# Patient Record
Sex: Female | Born: 1975 | Race: White | Hispanic: No | Marital: Married | Smoking: Never smoker
Health system: Southern US, Community
[De-identification: ages and names within clinical notes are randomized; demographics above are authoritative.]

## PROBLEM LIST (undated history)

## (undated) DIAGNOSIS — O009 Unspecified ectopic pregnancy without intrauterine pregnancy: Secondary | ICD-10-CM

## (undated) DIAGNOSIS — R7989 Other specified abnormal findings of blood chemistry: Secondary | ICD-10-CM

## (undated) HISTORY — PX: DILATION AND CURETTAGE OF UTERUS: SHX78

## (undated) HISTORY — DX: Other specified abnormal findings of blood chemistry: R79.89

## (undated) HISTORY — PX: SALPINGECTOMY: SHX328

---

## 2000-06-24 ENCOUNTER — Other Ambulatory Visit: Admission: RE | Admit: 2000-06-24 | Discharge: 2000-06-24 | Payer: Self-pay | Admitting: Obstetrics and Gynecology

## 2001-09-05 ENCOUNTER — Other Ambulatory Visit: Admission: RE | Admit: 2001-09-05 | Discharge: 2001-09-05 | Payer: Self-pay | Admitting: Obstetrics & Gynecology

## 2003-02-28 ENCOUNTER — Other Ambulatory Visit: Admission: RE | Admit: 2003-02-28 | Discharge: 2003-02-28 | Payer: Self-pay | Admitting: Obstetrics and Gynecology

## 2004-04-22 ENCOUNTER — Other Ambulatory Visit: Admission: RE | Admit: 2004-04-22 | Discharge: 2004-04-22 | Payer: Self-pay | Admitting: Obstetrics and Gynecology

## 2004-05-12 ENCOUNTER — Observation Stay (HOSPITAL_COMMUNITY): Admission: EM | Admit: 2004-05-12 | Discharge: 2004-05-13 | Payer: Self-pay | Admitting: Emergency Medicine

## 2004-06-25 ENCOUNTER — Inpatient Hospital Stay (HOSPITAL_COMMUNITY): Admission: AD | Admit: 2004-06-25 | Discharge: 2004-06-25 | Payer: Self-pay | Admitting: Obstetrics and Gynecology

## 2004-07-04 ENCOUNTER — Inpatient Hospital Stay (HOSPITAL_COMMUNITY): Admission: AD | Admit: 2004-07-04 | Discharge: 2004-07-04 | Payer: Self-pay | Admitting: Obstetrics and Gynecology

## 2004-07-07 ENCOUNTER — Inpatient Hospital Stay (HOSPITAL_COMMUNITY): Admission: AD | Admit: 2004-07-07 | Discharge: 2004-07-07 | Payer: Self-pay | Admitting: Obstetrics and Gynecology

## 2004-07-10 ENCOUNTER — Inpatient Hospital Stay (HOSPITAL_COMMUNITY): Admission: AD | Admit: 2004-07-10 | Discharge: 2004-07-10 | Payer: Self-pay | Admitting: Obstetrics and Gynecology

## 2004-07-13 ENCOUNTER — Inpatient Hospital Stay (HOSPITAL_COMMUNITY): Admission: AD | Admit: 2004-07-13 | Discharge: 2004-07-13 | Payer: Self-pay | Admitting: Obstetrics and Gynecology

## 2004-07-14 ENCOUNTER — Inpatient Hospital Stay (HOSPITAL_COMMUNITY): Admission: AD | Admit: 2004-07-14 | Discharge: 2004-07-14 | Payer: Self-pay | Admitting: Obstetrics and Gynecology

## 2004-07-16 ENCOUNTER — Inpatient Hospital Stay (HOSPITAL_COMMUNITY): Admission: AD | Admit: 2004-07-16 | Discharge: 2004-07-16 | Payer: Self-pay | Admitting: Obstetrics and Gynecology

## 2004-07-17 ENCOUNTER — Observation Stay (HOSPITAL_COMMUNITY): Admission: AD | Admit: 2004-07-17 | Discharge: 2004-07-18 | Payer: Self-pay | Admitting: Obstetrics and Gynecology

## 2004-07-18 ENCOUNTER — Encounter (INDEPENDENT_AMBULATORY_CARE_PROVIDER_SITE_OTHER): Payer: Self-pay | Admitting: Specialist

## 2004-08-27 ENCOUNTER — Ambulatory Visit (HOSPITAL_COMMUNITY): Admission: RE | Admit: 2004-08-27 | Discharge: 2004-08-27 | Payer: Self-pay | Admitting: Obstetrics and Gynecology

## 2005-04-24 ENCOUNTER — Ambulatory Visit (HOSPITAL_COMMUNITY): Admission: RE | Admit: 2005-04-24 | Discharge: 2005-04-24 | Payer: Self-pay | Admitting: Obstetrics and Gynecology

## 2005-04-25 ENCOUNTER — Inpatient Hospital Stay (HOSPITAL_COMMUNITY): Admission: AD | Admit: 2005-04-25 | Discharge: 2005-04-25 | Payer: Self-pay | Admitting: Obstetrics and Gynecology

## 2006-03-05 ENCOUNTER — Ambulatory Visit (HOSPITAL_COMMUNITY): Admission: RE | Admit: 2006-03-05 | Discharge: 2006-03-05 | Payer: Self-pay | Admitting: Obstetrics and Gynecology

## 2006-05-24 ENCOUNTER — Inpatient Hospital Stay (HOSPITAL_COMMUNITY): Admission: AD | Admit: 2006-05-24 | Discharge: 2006-05-27 | Payer: Self-pay | Admitting: Obstetrics and Gynecology

## 2008-07-09 ENCOUNTER — Inpatient Hospital Stay (HOSPITAL_COMMUNITY): Admission: AD | Admit: 2008-07-09 | Discharge: 2008-07-09 | Payer: Self-pay | Admitting: Obstetrics and Gynecology

## 2008-08-29 ENCOUNTER — Inpatient Hospital Stay (HOSPITAL_COMMUNITY): Admission: AD | Admit: 2008-08-29 | Discharge: 2008-09-01 | Payer: Self-pay | Admitting: Obstetrics and Gynecology

## 2008-12-27 ENCOUNTER — Encounter: Admission: RE | Admit: 2008-12-27 | Discharge: 2008-12-27 | Payer: Self-pay | Admitting: Obstetrics and Gynecology

## 2010-05-18 LAB — CBC
HCT: 27.4 % — ABNORMAL LOW (ref 36.0–46.0)
Hemoglobin: 12 g/dL (ref 12.0–15.0)
Hemoglobin: 7.6 g/dL — CL (ref 12.0–15.0)
Hemoglobin: 7.7 g/dL — CL (ref 12.0–15.0)
Hemoglobin: 9.6 g/dL — ABNORMAL LOW (ref 12.0–15.0)
MCHC: 34.3 g/dL (ref 30.0–36.0)
MCHC: 34.7 g/dL (ref 30.0–36.0)
MCHC: 35.1 g/dL (ref 30.0–36.0)
MCV: 91 fL (ref 78.0–100.0)
Platelets: 131 10*3/uL — ABNORMAL LOW (ref 150–400)
Platelets: 99 10*3/uL — ABNORMAL LOW (ref 150–400)
RBC: 2.4 MIL/uL — ABNORMAL LOW (ref 3.87–5.11)
RBC: 3.01 MIL/uL — ABNORMAL LOW (ref 3.87–5.11)
RBC: 3.87 MIL/uL (ref 3.87–5.11)
RDW: 14.7 % (ref 11.5–15.5)
RDW: 15.1 % (ref 11.5–15.5)

## 2010-05-18 LAB — RH IMMUNE GLOB WKUP(>/=20WKS)(NOT WOMEN'S HOSP)

## 2010-05-18 LAB — RPR: RPR Ser Ql: NONREACTIVE

## 2010-05-20 LAB — CBC
Hemoglobin: 11.3 g/dL — ABNORMAL LOW (ref 12.0–15.0)
MCHC: 35.6 g/dL (ref 30.0–36.0)
RBC: 3.48 MIL/uL — ABNORMAL LOW (ref 3.87–5.11)
RDW: 12.5 % (ref 11.5–15.5)
WBC: 11.6 10*3/uL — ABNORMAL HIGH (ref 4.0–10.5)

## 2010-05-20 LAB — COMPREHENSIVE METABOLIC PANEL
AST: 15 U/L (ref 0–37)
BUN: 11 mg/dL (ref 6–23)
Calcium: 8.4 mg/dL (ref 8.4–10.5)
Chloride: 105 mEq/L (ref 96–112)
Creatinine, Ser: 0.84 mg/dL (ref 0.4–1.2)
GFR calc non Af Amer: >60 mL/min (ref >60)
Glucose, Bld: 103 mg/dL — ABNORMAL HIGH (ref 70–99)
Potassium: 3.9 mEq/L (ref 3.5–5.1)
Sodium: 135 mEq/L (ref 135–145)
Total Protein: 5.9 g/dL — ABNORMAL LOW (ref 6.0–8.3)

## 2010-05-20 LAB — URINALYSIS, ROUTINE W REFLEX MICROSCOPIC
Glucose, UA: NEGATIVE mg/dL
Protein, ur: NEGATIVE mg/dL
Urobilinogen, UA: 0.2 mg/dL (ref 0.0–1.0)
pH: 6 (ref 5.0–8.0)

## 2010-05-20 LAB — SEDIMENTATION RATE: Sed Rate: 81 mm/hr — ABNORMAL HIGH (ref 0–22)

## 2010-05-20 LAB — URIC ACID: Uric Acid, Serum: 5 mg/dL (ref 2.4–7.0)

## 2010-06-24 NOTE — Consult Note (Signed)
[name], [name] NO.:  [id number]   MEDICAL RECORD NO.:  [id number]          PATIENT TYPE:  MAT   LOCATION:  MATC                          [facility]ACILITY:  [facility]   PHYSICIAN:  Amber Christmas, MD    DATE OF BIRTH:  1975/07/12   DATE OF CONSULTATION:  07/09/2008  DATE OF DISCHARGE:  07/09/2008                                 CONSULTATION   REFERRING PHYSICIAN:  Juluis Mire, MD   REASON FOR CONSULTATION:  New onset of headache.   HISTORY OF PRESENT ILLNESS:  This is a 35 year old lady who is [redacted] weeks  pregnant, presenting with new onset of left temporal headache.  The  patient woke up with headache about 5-1/2 hours ago.  Exact onset is  unclear.  She describes it as constant pain.  There is local tenderness  to pressure in the left temporal area.  She also feels a slight  exaggeration with gritting of her teeth.  She has had mild pain  involving her left jaw as well.  In severity, she describes it as 7/10.  There is no associated photophobia nor nausea.  There is no previous  history of headaches.  She has had no focal weakness or numbness, but no  speech changes.  No changes in her level of consciousness or mental  status.  The patient has not taken medication for pain.   PAST MEDICAL HISTORY:  Fairly unremarkable.  She has no known systemic  medical disorder.  This is her second pregnancy.  First pregnancy was  uncomplicated.   The patient takes no medications.   FAMILY HISTORY:  Noncontributory.   PHYSICAL EXAMINATION:  GENERAL APPEARANCE:  This is an elderly young  lady of medium build, who appear to be in third-trimester pregnancy.  She was alert and cooperative, and in no acute distress.  Her mental  status was normal.  HEENT:  Pupils, extraocular movements and visual fields were normal.  Fundi were normal.  MUSCULOSKELETAL:  There was no facial numbness.  No facial weakness.  She had mild tenderness in the mid and anterior temporal regions to  pressure.  There was no tenderness over her temporomandibular joint.  Hearing was normal.  Speech and palatal movement were normal.  Strength  and muscle tone were normal throughout.  Deep tendon reflexes were  normal and symmetrical.  Plantar response were flexor.  Sensory exam was  normal.  Coordination was normal.   CT scan of her head was obtained without contrast.  The study was  negative for intracranial abnormality.   CLINICAL IMPRESSION:  1. Local left temporal headache with tenderness, is of unclear      etiology, but most likely muscle contraction is primary cause.      Temporal arthritis is less likely, particularly at her age.  2. Migraine headache is also unlikely, the absence of nausea and      photophobia as well as like of throbbing quality of her pain.   RECOMMENDATIONS:  1. No further neurodiagnostic studies are indicated.  2. The patient has agreed to take Tylenol 500 mg 2  tablets for pain.      We further recommend that if Tylenol is not adequate and the      patient agrees, trial of Midrin 2 initially and 1 q.1 h. x3 p.r.n.      The patient is not to exceed 5 Midrin capsules in 24 hours.  3. Sedimentation rate.   Thank you for asking me to evaluate [name].      Amber Christmas, MD  Electronically Signed     CS/MEDQ  D:  07/09/2008  T:  07/09/2008  Job:  [id number]

## 2010-06-27 NOTE — Discharge Summary (Signed)
[name], [name]                ACCOUNT NO.:  [id number]   MEDICAL RECORD NO.:  [id number]          PATIENT TYPE:  INP   LOCATION:  9317                          [facility]ILITY[facility]   PHYSICIAN:  Dineen Kid. Rana Snare, M.D.    DATE OF BIRTH:  03-Mar-1975   DATE OF ADMISSION:  07/17/2004  DATE OF DISCHARGE:  07/18/2004                                 DISCHARGE SUMMARY   HISTORY OF PRESENT ILLNESS:  [name] is a 35 year old G2, P0 A1 with a  known ectopic, confirmed by ultrasound, status post methotrexate x2.  She  did have some decrease in beta hCG titers.  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  She was brought to the emergency room and clinical exam was suspicious for a  ruptured ectopic.  Hemoglobin had dropped from 12.7 three days to 11.5.  Planned laparoscopic evaluation for probable ruptured ectopic.   HOSPITAL COURSE:  The patient underwent laparoscopy at which time noted a  large hemoperitoneum with at least 500 mL of blood.  There was a left distal  ampullary ectopic that had ruptured through into the mesosalpinx requiring a  left salpingectomy and evacuation of the hemoperitoneum.  The surgery was  uncomplicated.  Her postoperative care was similarly uncomplicated.  By  postoperative day #1, she was ambulating without difficulty and tolerating a  regular diet.  Her postoperative hemoglobin had dropped to 8.4.  The  incisions were clean, dry and intact with normoactive bowel sounds.  The  patient was discharged home with follow up in the office in three to five  days.   DISPOSITION:  The patient will be discharged home to follow up in the office  in three to five days.  A prescription for Darvocet #30.  Told to return for  any increased pain, fever or bleeding.  She did receive RhoGAM three weeks  ago for Rh negative.       DCL/MEDQ  D:   07/18/2004  T:  07/18/2004  Job:  [id number]

## 2010-06-27 NOTE — Consult Note (Signed)
NAMELUNAH, [name]                ACCOUNT NO.:  [id number]   MEDICAL RECORD NO.:  [id number]          PATIENT TYPE:  INP   LOCATION:  3002                         FACILITY:  [facility]   PHYSICIAN:  Petra Kuba, M.D.    DATE OF BIRTH:  May 30, 1975   DATE OF CONSULTATION:  05/13/2004  DATE OF DISCHARGE:                                   CONSULTATION   HISTORY:  Patient seen at the request of Dr. Angelena Sole for elevated bilirubin.  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  She  had essentially no GI history although did have a bout in [place] years ago  where she had pain but no vomiting where she had to go to the emergency  room, but has been in her normal health until vacationing in [place] this  weekend, ate some sea bass, which is the only thing different she and her  husband ate, had some lower abdominal pain, diarrhea, nausea, vomiting, and  had some low back pain.  She came to the emergency room, had an elevated  bilirubin, and was admitted for dehydration and further workup and plans.  Today she is actually feeling better.  He yellow jaundice is better, and she  is able to eat and has no specific complaints.  She has never had a bout of  yellow jaundice before, has not been sick, and has not had much illnesses  other than the above one episode.   PAST MEDICAL HISTORY:  Negative.  She has not had any surgeries.   MEDICATIONS AT HOME:  None.   ALLERGIES:  CODEINE.   SOCIAL HISTORY:  Denies tobacco.  Occasionally drinks.  She has been trying  to get pregnant.   FAMILY HISTORY:  Negative for any history of hepatitis or any other obvious  GI problems.   REVIEW OF SYSTEMS:  Improved.  She did notice having dark urine yesterday as  well.   Physical exam per the primary team was nondiagnostic.   Labs are pertinent for a white count of 2.5, mostly lymphocytes compared to  neutrophils.  Hemoglobin was 14.1, but hemoglobin did drop with hydration to  12.7, MCV normal, RDW normal.  Platelet count did drop from 161 to 124.  She  did have 19% monocytes.  Coagulation studies are normal.  Chemistries except  for a potassium of 3.4 normal, except for a bilirubin of 2.9, which dropped  with hydration to 2.1, direct was 0.3.  Normal LDH.  Normal amylase and  lipase.  Pregnancy test negative.  Hepatitis B surface antigen negative.  Hepatitis C antibody negative.  Urinalysis was pertinent for 1.0  urobilinogen, 0-2 red cells, otherwise negative.  Pending tests at the time  of dictation include hepatitis A and haptoglobin.   ASSESSMENT:  1.  Probable viral syndrome.  2.  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   PLAN:  I have discussed Gilbert's with the patient and her family.  I have  written it down for them.  I think since she is  eating fine and feeling  better, can go home, and I will follow up the labs in one week just to  repeat liver tests as well as CBC and differential to make sure they are  back to normal and have her call me sooner p.r.n., which we discussed, if  any recurrence of symptoms, weak, run down, etc.      MEM/MEDQ  D:  05/13/2004  T:  05/13/2004  Job:  [id number]   cc:   [facility] Team

## 2010-06-27 NOTE — Op Note (Signed)
[name], Kent                ACCOUNT NO.:  [id number]   MEDICAL RECORD NO.:  [id number]          PATIENT TYPE:  INP   LOCATION:  9317                          [facility]   PHYSICIAN:  Dineen Kid. Rana Snare, M.D.    DATE OF BIRTH:  28-May-1975   DATE OF PROCEDURE:  DATE OF DISCHARGE:                                 OPERATIVE REPORT   PREOPERATIVE DIAGNOSIS:  Left ectopic pregnancy, probable ruptured ectopic  pregnancy with hemoperitoneum.   POSTOPERATIVE DIAGNOSIS:  Left tubal ectopic pregnancy, ruptured with  hemoperitoneum.   SURGEON:  Dineen Kid. Rana Snare, M.D.   ANESTHESIA:  General endotracheal anesthesia.   OPERATION/PROCEDURE:  Laparoscopy with lysis of adhesions and left  salpingectomy and evacuation of hemoperitoneum.   INDICATIONS:  [name] is a 35 year old gravida 2, para 0, AB 1 with a  known ectopic based upon ultrasound findings and also beta hCGs, failed  methotrexate therapy x2.  She has also received RhoGAM.  Today was doubled  over with acute abdominal discomfort and fallen hemoglobin.  Desired  surgical evaluation and treatment and planned laparoscopy with possible left  salpingectomy versus left salpingostomy.  The risks and benefits were  discussed at length and informed consent was obtained.   FINDINGS:  At the time of surgery hemoperitoneum with approximately 500 mL  of blood, a left distal ampullary ectopic which has ruptured both through  the fimbriated end and also through the mesosalpinx.  There were some  adhesions from the omentum to the left adnexa and from the left fallopian  tube to the pelvic sidewall.  Normal-appearing liver.  Normal-appearing  uterus and normal-appearing right fallopian tube and ovary.   DESCRIPTION OF PROCEDURE:  After adequate analgesia, the patient was placed  in the dorsal lithotomy position.  She was sterilely prepped and draped.  Bladder was sterilely drained.  A tenaculum was placed on the anterior lip  of the cervix  and a Cohen tenaculum was placed.  Once an infraumbilical skin  incision was made, a Veress needle was inserted.  The abdomen was  insufflated to dullness to percussion.  A 11-mm trocar was inserted.  The  laparoscope was inserted and the above findings were noted.  A 5-mm trocar  was inserted to the left of the midline two fingerbreadths from the pubic  symphysis under direct visualization.  Hemoperitoneum was evacuated with  copious amount of irrigation through a Nezhat suction irrigator after  evacuating most of the hemoperitoneum.  Careful examination of the left  fallopian tube revealed rupture in two different places in the mesosalpinx  and also through the fimbriated end. At this time it appeared that there  were no way to salvage the fallopian tube.  A Gyrus tripolar was used to  coagulate across the fallopian tube in the mid portion of the tube down  through the mesosalpinx with dissection across the mesosalpinx and  hemostasis was achieved and the good portion of the distal end of the  fallopian tube was removed with ectopic intact.  Lysis of adhesions was  carried out from the omentum to the left  adnexal area and also from the  distal end of the fallopian tube from the pelvic sidewall.  After copious  amount of irrigation and adequate hemostasis was assured, reexamination of  the right fallopian tube revealed normal fimbriated end, normal fallopian  tube.  No evidence of scar tissue or endometriosis was noted.  The fallopian  tube and ectopic were removed through the umbilicus as were several large  clots.  The abdomen was irrigated and after adequate hemostasis was assured,  the trocars were removed.  The infraumbilical skin incision was closed with  a 0 Vicryl interrupted suture in the fascia and a 3-0 Vicryl Rapide  subcuticular suture.  The 5-mm site was closed with 3-0 Vicryl Rapide  subcuticular suture.  The incisions were injected with 0.25% Marcaine, a  total of 10 mL  used.  The tenaculum was removed from the cervix.  The  bladder was sterilely drained.  Cervix was noted to be hemostatic. The  patient was then transferred to the recovery room in stable condition.  She  received 1 g of Rocephin preoperatively, 30 mg of Toradol postoperatively.  Sponge and instrument were correct x3.  Total blood loss was hemoperitoneum  of 500 mL.   DISPOSITION:  [name] will be admitted in op status for the night.       DCL/MEDQ  D:  07/17/2004  T:  07/18/2004  Job:  [id number]

## 2010-06-27 NOTE — H&P (Signed)
[name], [name]                ACCOUNT NO.:  [id number]   MEDICAL RECORD NO.:  [id number]          PATIENT TYPE:  EMS   LOCATION:  MAJO                         FACILITY:  [facility]   PHYSICIAN:  Lonia Blood, M.D.      DATE OF BIRTH:  Feb 14, 1975   DATE OF ADMISSION:  05/12/2004  DATE OF DISCHARGE:                                HISTORY & PHYSICAL   ADDENDUM:   PHYSICAL EXAMINATION:  VITAL SIGNS: Temperature 98.6, blood pressure 124/83,  pulse 91, respiratory rate 16, saturation 99% on room air.  GENERAL: Alert and oriented, in no acute distress.  HEENT: PERRL. EOMI.  NECK: Supple. No JVD. No lymphadenopathy.  CARDIOVASCULAR: Regular rate and rhythm.  ABDOMEN: Soft, nontender with positive bowel sounds.  EXTREMITIES: No edema, cyanosis, or clubbing.   LABORATORY DATA:  White count 2.5, hemoglobin 14.1, platelet count 161,000,  sodium 138, potassium 3.4, chloride 102, CO2 28, glucose 111, BUN 8,  creatinine 0.9, calcium 9.0, total protein 6.7, albumin 4.0, AST 16, ALT 14,  alkaline phosphatase 49, and total bilirubin 2.9.  Urine pregnancy is  negative. PT is 13.5, INR 1.1, PTT 33 seconds.   ASSESSMENT:  This is a 35 year old presenting with isolated  hyperbilirubinemia, abdominal pain, nausea, vomiting as well as hyperkalemia  and leukopenia. Also the patient was taking seafood at a wedding. Per  patient, she is not aware of any member of her entourage eating the same  course. She is the only one.  She is also not aware if any other people that  attended the wedding were sick. Her symptoms are likely to be due to some  form of food poisoning, the most worrisome being hepatitis A. Other  possibilities are drugs or some other infection. The fact that her other  LFTs are okay including alkaline phosphatase shows that this may not be any  major obstruction of the common bile duct including pancreatitis or  gallstones. Will therefore proceed with these: (1) Admit for 23-hour  observation, mainly instituting conservative measures. (2) Continue to  follow up hepatitis panel, lipase, UGS, and UA. (3) Also correct her  potassium. (4) Will also hydrate the patient and control her nausea so that  she can eat appropriately. If all these measures seem to have resolved the  patient's symptoms then would endeavor to discharge the patient home to rest  at least one more day prior to going back to work.      LG/MEDQ  D:  05/12/2004  T:  05/12/2004  Job:  [id number]

## 2010-06-27 NOTE — H&P (Signed)
NAMEKATRINNA, Kent                ACCOUNT NO.:  [id number]   MEDICAL RECORD NO.:  [id number]          PATIENT TYPE:  MAT   LOCATION:  MATC                          [facility]   PHYSICIAN:  Dineen Kid. Rana Snare, M.D.    DATE OF BIRTH:  [birth year]   DATE OF ADMISSION:  07/17/2004  DATE OF DISCHARGE:                                HISTORY & PHYSICAL   HISTORY OF PRESENT ILLNESS:  [name] is a 35 year old G2, P0, AB 1, with  known history of ectopic diagnosed by elevated beta HCGs and ultrasound  showing a left adnexal ectopic.  She has undergone methotrexate x2.  She  began having some decrease in the beta HCGs.  Three days ago she began  having left lower quadrant discomfort, was intermittent, lasting for  approximately a half hour.  It recurred approximately six hours later.  Hemoglobin the following day dropped minimally and she was asymptomatic.  Today the patient experienced acute left lower quadrant pain which doubled  her over.  She was a Runner, broadcasting/film/video at [employer] and was curled up on  the floor for approximately an hour and a half with unrelenting pain, was  brought to the emergency room.  She called me at home and asked me to come  in and do the surgery for her.  She had previously contacted me to discuss  different questions, laboratory questions, and discussion of options, and  today when she had acute abdominal pain, did ask me to perform a laparoscopy  for her.  Upon arrival at the [facility], she did appear to be in  moderate distress.  Her hemoglobin had dropped from 12.7 three days ago to  11.8 yesterday to 11.5 today.  She was writhing in pain and her abdomen was  guarded.  Because of the continued pain and known ectopic, she desired to  obtain surgical intervention and planned laparoscopic removal of the  ectopic.  She is Rh negative and received RhoGAM approximately three weeks  ago.   PAST MEDICAL HISTORY:  Significant for a liver ailment earlier this  year,  thought to be food poisoning.   PAST SURGICAL HISTORY:  Negative.   PAST OB HISTORY:  Negative.   PAST GYN HISTORY:  Negative.   MEDICATIONS:  Prenatal vitamins.   ALLERGIES:  She has an allergy to CODEINE, which causes severe nausea.   PHYSICAL EXAMINATION:  VITAL SIGNS:  Temperature is 99.3, pulse 101,  respirations 24, blood pressure 137/88.  HEART:  Regular rate and rhythm.  LUNGS:  Clear to auscultation bilaterally.  ABDOMEN:  Nondistended but guarding bilateral lower quadrants with rebound.  PELVIC:  Exam was deferred.   IMPRESSION/PLAN:  Known ectopic based on ultrasound and beta HCGs, now with  acute abdominal discomfort and probable ruptured ectopic.  Discussed options  at length.   PLAN:  Laparoscopy with removal of ectopic, possible left salpingectomy.  Risks and benefits of the procedure were discussed at length, which include,  but are not limited to, risk of infection, bleeding, damage to the uterus,  tubes, ovaries, bowel, bladder, risks associated  with anesthesia, risks  associated with blood transfusion.  She does give her informed consent and  wishes to proceed.       DCL/MEDQ  D:  07/17/2004  T:  07/17/2004  Job:  [id number]

## 2010-06-27 NOTE — H&P (Signed)
[name], [name]                ACCOUNT NO.:  [id number]   MEDICAL RECORD NO.:  [id number]          PATIENT TYPE:  EMS   LOCATION:  MAJO                         FACILITY:  [facility]   PHYSICIAN:  Lonia Blood, M.D.      DATE OF BIRTH:  1975/03/23   DATE OF ADMISSION:  05/12/2004  DATE OF DISCHARGE:                                HISTORY & PHYSICAL   PRIMARY CARE PHYSICIAN:  Unassigned.   PRESENTING COMPLAINT:  Abdominal pain, nausea and vomiting, and yellow eyes.   HISTORY OF PRESENT ILLNESS:  This is a 35 year old white female with no  significant past medical history, who was at a wedding in [place] this  past weekend and ate sea bass.  Since then, the patient has been feeling  sick.  She starting having abdominal pain, nausea, and vomiting the next  day.  The patient also had some diarrhea.  She was not sure if she had any  fevers, however.  The patient came back to [facility] today and called Dr.  Rito Ehrlich, who asked her to come over.  Her husband noticed that her eyes  have been yellow, and her skin has also looked pale and yellowish since the  incident.  The patient has not been able to eat or drink so far today.   PAST MEDICAL HISTORY:  None.   MEDICATIONS:  None.   ALLERGIES:  The patient is allergic to    CODEINE.   SOCIAL HISTORY:  The patient is married.  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  Denied any tobacco use.  Occasionally drinks socially.  The  patient has been trying to get pregnant.   FAMILY HISTORY:  Denies any family history of hepatitis.  Denied any family  history of cancer, diabetes, or hypertension.   REVIEW OF SYSTEMS:  Mainly as in HPI.  Total system review was normal.   PHYSICAL EXAMINATION:  VITAL SIGNS:  Temperature 98.6, blood pressure  124/83, pulse 91, respiratory rate 16, O2 saturation 99% on room air.  GENERAL:  The patient is alert and oriented but ashen.  HEENT:  PERRLA.  EOMI.  NECK:  Supple, no JVD, no lymphadenopathy.  CARDIOVASCULAR:  Regular rate and rhythm.  ABDOMEN:  Soft, nontender, positive bowel sounds.  EXTREMITIES:  Show no edema, cyanosis, or clubbing.   LABORATORY DATA AND OTHER STUDIES:  White count 2.5, hemoglobin 14.1, and  platelet count 161.  Sodium 138, potassium 3.4, chloride 102,  CO2 28,  glucose 111, BUN 8, creatinine 0.9, calcium 9.0, total protein 6.7, albumin  4.0, AST 16, ALT 14, alkaline phosphatase 49, total bilirubin 2.9, amylase  35.  Coags showed a PT of 13.5, INR 1.1, PTT 33.   ASSESSMENT:  This is a 35 year old presenting with abdominal pain, nausea,  vomiting.  Haptoglobin smear as well as leukopenia.  Symptoms apparently  followed eating seafood at a wedding.  Per patient, she is not sure of  someone else has been sick, but no one in her entourage ate the same type of  food that she ate, especially the sea bass.  She denies taking any other  medications or drugs.  From the look of things, the patient may have had  some kind of food poisoning.  Of worrisome nature is hepatitis A which could  give her the jaundice as well as the malaise and generalized weakness that  she is having.  The patient, however, has normal liver function tests except  for the total bilirubin.  It may have been that they normalized prior to the  total bilirubin.  Alkaline phosphatase is also normal, so it does not seem  like obstruction of the bile ducts.  At this time, we will proceed as  follows.   1.  We will admit her for   DICTATION ENDS AT THIS POINT.      LG/MEDQ  D:  05/12/2004  T:  05/12/2004  Job:  [id number]

## 2012-01-21 ENCOUNTER — Other Ambulatory Visit: Payer: Self-pay | Admitting: Obstetrics and Gynecology

## 2012-01-21 DIAGNOSIS — R928 Other abnormal and inconclusive findings on diagnostic imaging of breast: Secondary | ICD-10-CM

## 2012-01-26 ENCOUNTER — Ambulatory Visit
Admission: RE | Admit: 2012-01-26 | Discharge: 2012-01-26 | Disposition: A | Discharge status: Home / Self Care | Payer: Managed Care, Other (non HMO) | Source: Ambulatory Visit | Attending: Obstetrics and Gynecology | Admitting: Obstetrics and Gynecology

## 2012-01-26 ENCOUNTER — Other Ambulatory Visit: Payer: Self-pay | Admitting: Obstetrics and Gynecology

## 2012-01-26 DIAGNOSIS — R928 Other abnormal and inconclusive findings on diagnostic imaging of breast: Secondary | ICD-10-CM

## 2014-04-24 ENCOUNTER — Other Ambulatory Visit: Payer: Self-pay | Admitting: Internal Medicine

## 2014-04-24 DIAGNOSIS — M542 Cervicalgia: Secondary | ICD-10-CM

## 2014-04-26 ENCOUNTER — Ambulatory Visit
Admission: RE | Admit: 2014-04-26 | Discharge: 2014-04-26 | Disposition: A | Discharge status: Home / Self Care | Payer: Managed Care, Other (non HMO) | Source: Ambulatory Visit | Attending: Internal Medicine | Admitting: Internal Medicine

## 2014-04-26 ENCOUNTER — Other Ambulatory Visit: Payer: Self-pay | Admitting: Internal Medicine

## 2014-04-26 DIAGNOSIS — M542 Cervicalgia: Secondary | ICD-10-CM

## 2014-07-26 ENCOUNTER — Other Ambulatory Visit: Payer: Self-pay | Admitting: Obstetrics and Gynecology

## 2014-07-27 LAB — CYTOLOGY - PAP

## 2016-11-23 ENCOUNTER — Encounter (HOSPITAL_COMMUNITY): Payer: Self-pay | Admitting: Emergency Medicine

## 2016-11-23 ENCOUNTER — Emergency Department (HOSPITAL_COMMUNITY)
Admission: EM | Admit: 2016-11-23 | Discharge: 2016-11-23 | Disposition: A | Discharge status: Home / Self Care | Payer: Managed Care, Other (non HMO) | Attending: Emergency Medicine | Admitting: Emergency Medicine

## 2016-11-23 ENCOUNTER — Emergency Department (HOSPITAL_COMMUNITY): Payer: Managed Care, Other (non HMO)

## 2016-11-23 DIAGNOSIS — Y999 Unspecified external cause status: Secondary | ICD-10-CM | POA: Insufficient documentation

## 2016-11-23 DIAGNOSIS — Y92410 Unspecified street and highway as the place of occurrence of the external cause: Secondary | ICD-10-CM | POA: Diagnosis not present

## 2016-11-23 DIAGNOSIS — Y9389 Activity, other specified: Secondary | ICD-10-CM | POA: Insufficient documentation

## 2016-11-23 DIAGNOSIS — S161XXA Strain of muscle, fascia and tendon at neck level, initial encounter: Secondary | ICD-10-CM | POA: Diagnosis not present

## 2016-11-23 DIAGNOSIS — S199XXA Unspecified injury of neck, initial encounter: Secondary | ICD-10-CM | POA: Diagnosis present

## 2016-11-23 HISTORY — DX: Unspecified ectopic pregnancy without intrauterine pregnancy: O00.90

## 2016-11-23 NOTE — Discharge Instructions (Signed)
Return here as needed. Follow up with your doctor. °

## 2016-11-23 NOTE — ED Notes (Signed)
Patient transported to CT 
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
## 2016-11-23 NOTE — ED Notes (Signed)
Pt stable,ambulatory, and verbalizes understanding of D/C instructions.   
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
## 2019-04-08 ENCOUNTER — Ambulatory Visit: Payer: Managed Care, Other (non HMO) | Attending: Internal Medicine

## 2019-04-08 DIAGNOSIS — Z23 Encounter for immunization: Secondary | ICD-10-CM | POA: Insufficient documentation

## 2019-04-08 NOTE — Progress Notes (Signed)
   Covid-19 Vaccination Clinic  Name:  ZIAN MOHAMED    MRN: [id number] DOB: 1975/05/05  04/08/2019  Ms. Huffstetler was observed post Covid-19 immunization for 15 minutes without incidence. She was provided with Vaccine Information Sheet and instruction to access the V-Safe system.   Ms. Kizziah was instructed to call 911 with any severe reactions post vaccine: Marland Kitchen Difficulty breathing  . Swelling of your face and throat  . A fast heartbeat  . A bad rash all over your body  . Dizziness and weakness    Immunizations Administered    Name Date Dose VIS Date Route   Pfizer COVID-19 Vaccine 04/08/2019  9:07 AM 0.3 mL 01/20/2019 Intramuscular   Manufacturer: [employer], [employer]   Lot: [id number]   NDC: [address]-0

## 2019-04-29 ENCOUNTER — Ambulatory Visit: Payer: Managed Care, Other (non HMO) | Attending: Internal Medicine

## 2019-04-29 DIAGNOSIS — Z23 Encounter for immunization: Secondary | ICD-10-CM

## 2019-04-29 NOTE — Progress Notes (Signed)
**Note De-Identified Amber Obfuscation**    Covid-19 Vaccination Clinic  Name:  [name]    MRN: [id number] DOB: 1976-01-16  04/29/2019  [name] was observed post Covid-19 immunization for 15 minutes without incident. She was provided with Vaccine Information Sheet and instruction to access the V-Safe system.   [name] was instructed to call 911 with any severe reactions post vaccine: Marland Kitchen Difficulty breathing  . Swelling of face and throat  . A fast heartbeat  . A bad rash all over body  . Dizziness and weakness   Immunizations Administered    Name Date Dose VIS Date Route   Pfizer COVID-19 Vaccine 04/29/2019 11:31 AM 0.3 mL 01/20/2019 Intramuscular   Manufacturer: [employer], [employer]   Lot: [id number]   NDC: [address]-0    '

## 2019-05-03 ENCOUNTER — Ambulatory Visit: Payer: Managed Care, Other (non HMO)

## 2019-07-18 IMAGING — CT CT HEAD W/O CM
4 of 8 series · 16 of 47 positions shown, 18 images · non-contrast
Comparison: 04/26/2014, 07/09/2008

CLINICAL DATA: Trauma

EXAM:
CT HEAD WITHOUT CONTRAST
CT CERVICAL SPINE WITHOUT CONTRAST
TECHNIQUE: Multidetector CT imaging of the head and cervical spine was
performed following the standard protocol without intravenous
contrast. Multiplanar CT image reconstructions of the cervical spine
were also generated.

[Series 4: head bone · axial · 0.42mm/px · z∈[-152,-58]mm · 5 of 83 slices shown]
[im 12/83  bone]
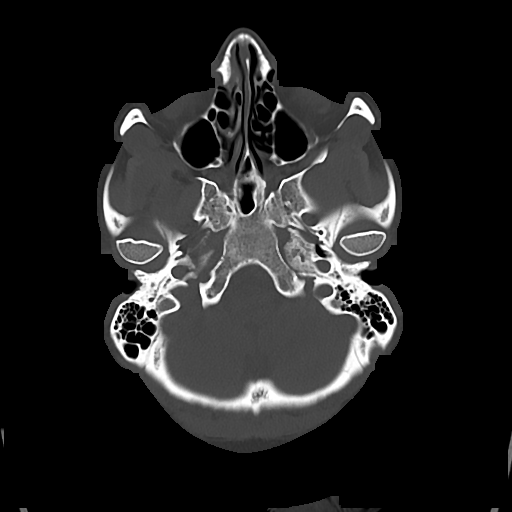
[im 24/83  bone]
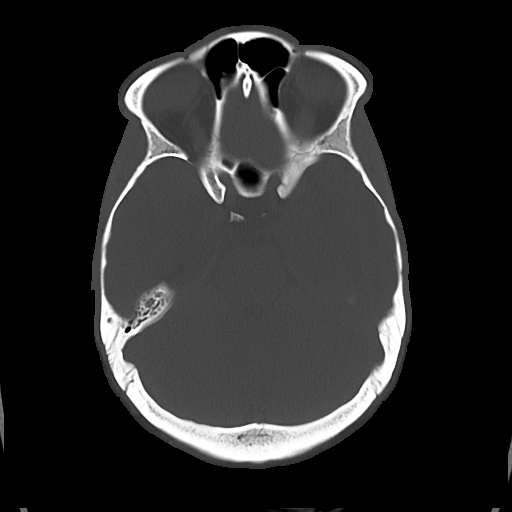
[im 36/83  bone]
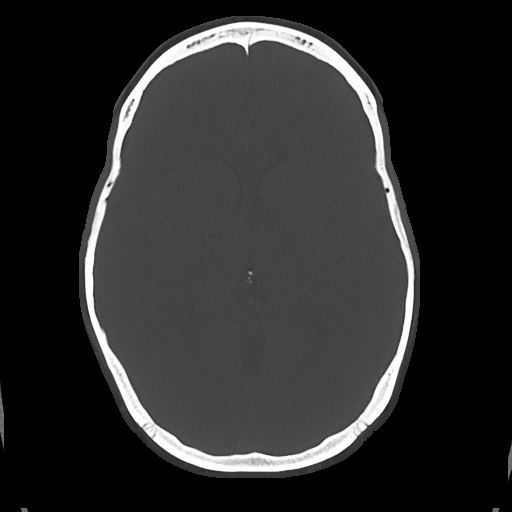
[im 47/83  bone]
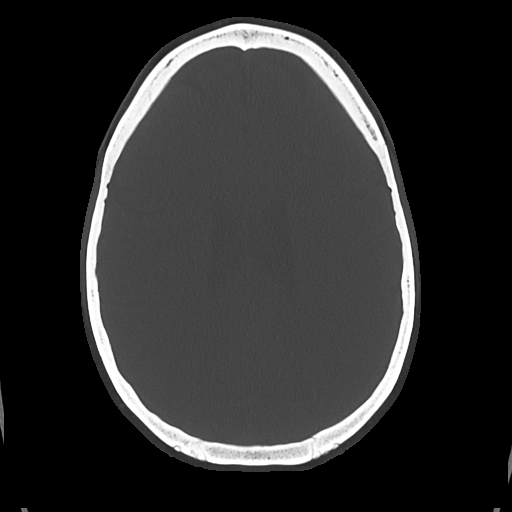
[im 59/83  bone]
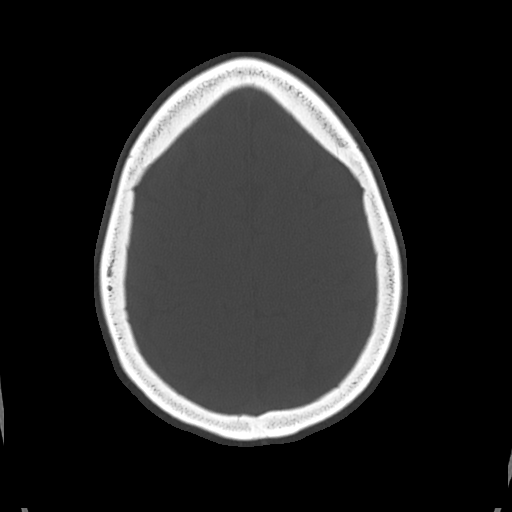

[Series 5: cor soft · coronal · 0.32mm/px · 3 of 73 slices shown]
[im 21/73  brain]
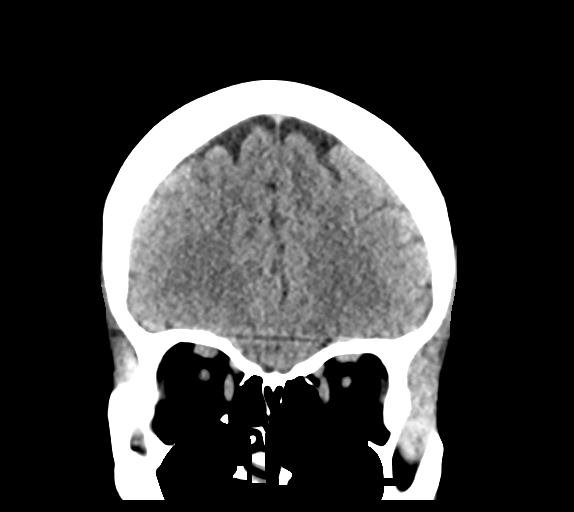
[im 31/73  brain]
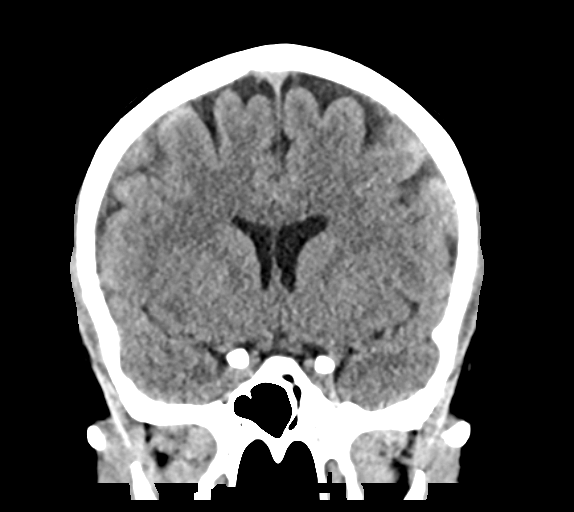
[im 42/73  brain]
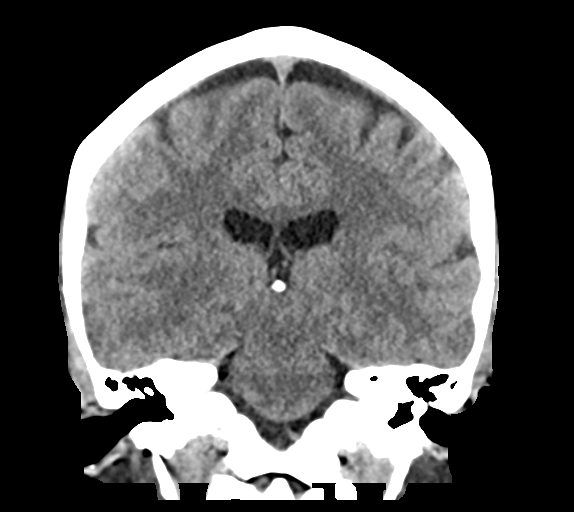

[Series 6: sag soft · sagittal · 0.32mm/px · 1 of 62 slices shown]
[im 31/62  brain]
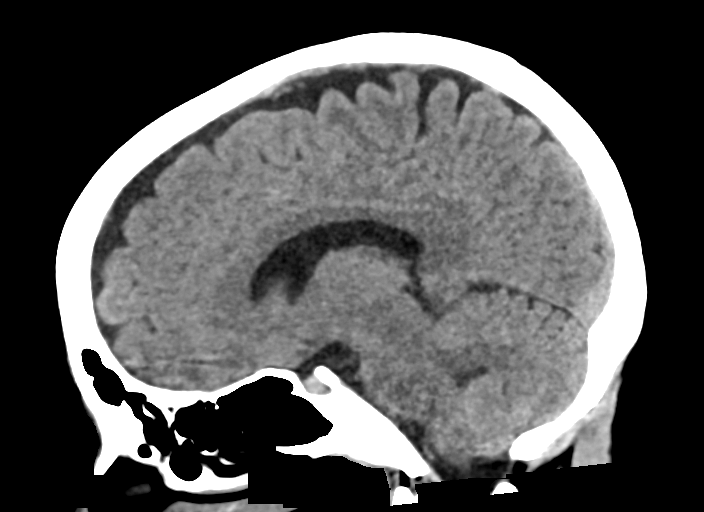

[Series 12: orthogonal axials · axial · 0.21mm/px · z∈[-309,-196]mm · 7 of 91 slices shown, 9 images]
[im 12/91  brain]
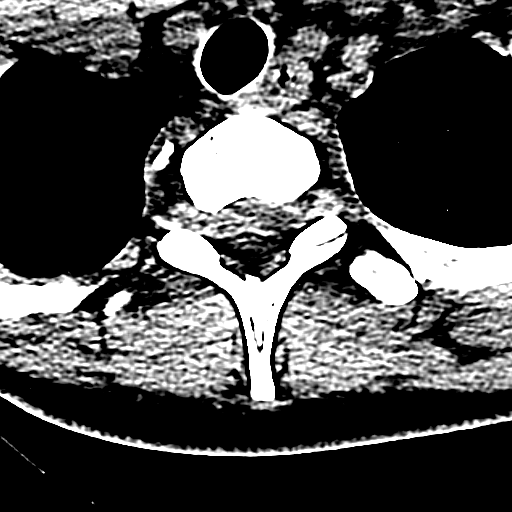
[im 12/91  bone]
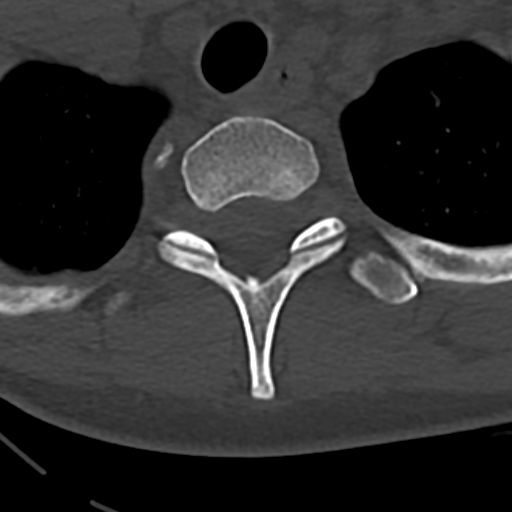
[im 23/91  brain]
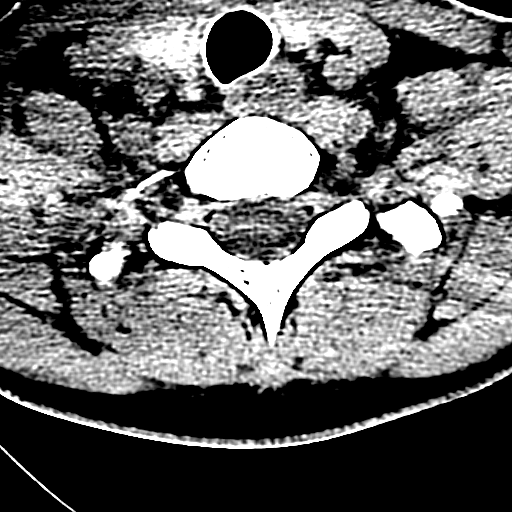
[im 34/91  brain]
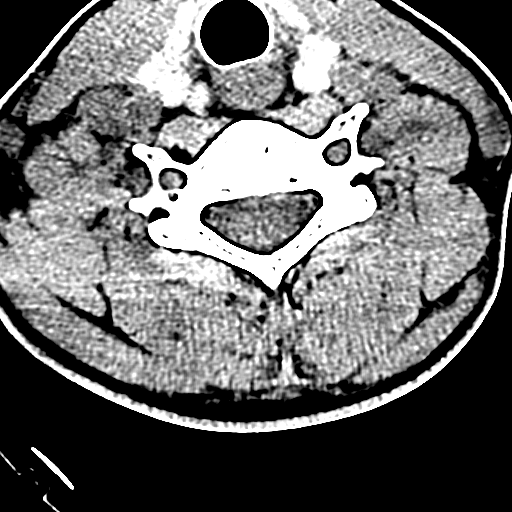
[im 46/91  brain]
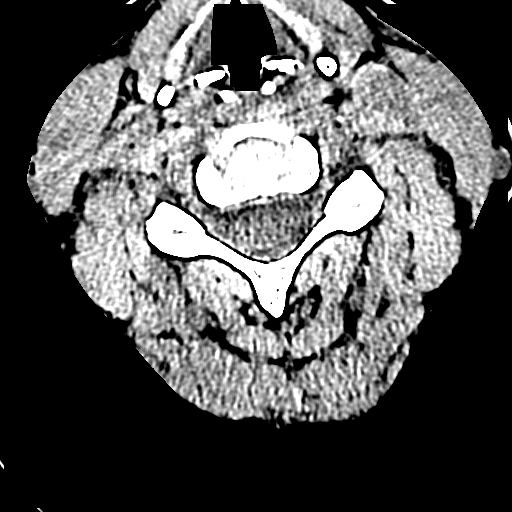
[im 57/91  brain]
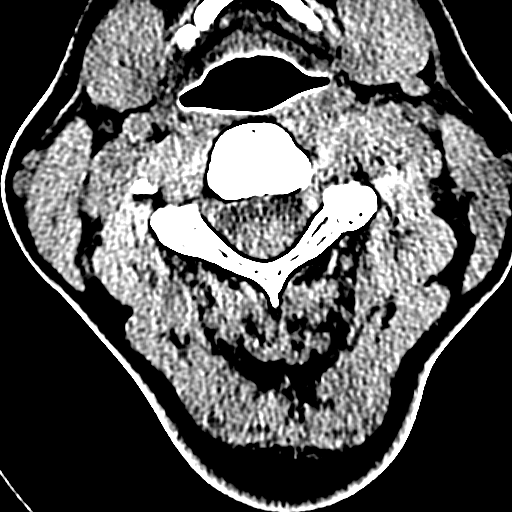
[im 57/91  bone]
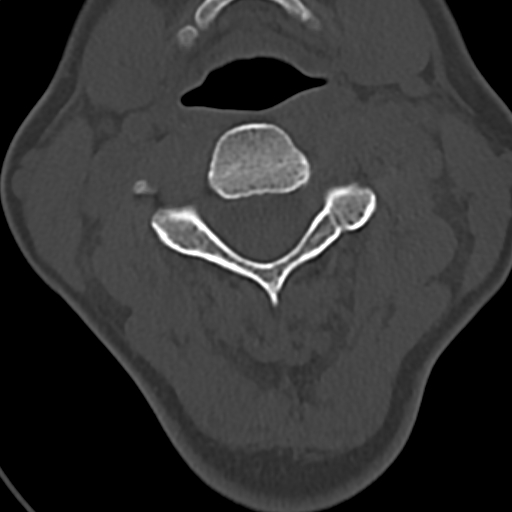
[im 68/91  brain]
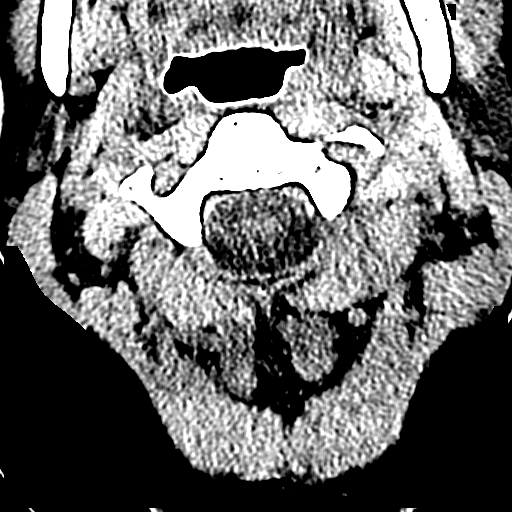
[im 79/91  brain]
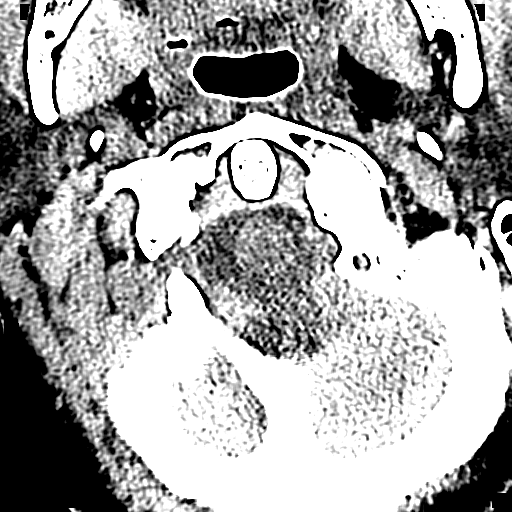

[16 of 47 positions shown; findings below may reference images not displayed]

FINDINGS: CT HEAD FINDINGS

Brain: No evidence of acute infarction, hemorrhage, hydrocephalus,
extra-axial collection or mass lesion/mass effect.

Vascular: No hyperdense vessel or unexpected calcification.

Skull: Normal. Negative for fracture or focal lesion.

Sinuses/Orbits: No acute finding.

Other: None

CT CERVICAL SPINE FINDINGS

Alignment: Mild straightening of the cervical spine. No subluxation.
Facet alignment is within normal limits.

Skull base and vertebrae: No acute fracture. No primary bone lesion
or focal pathologic process.

Soft tissues and spinal canal: No prevertebral fluid or swelling. No
visible canal hematoma.

Disc levels:  Mild degenerative changes at C5-C6.

Upper chest: Negative.

Other: None
IMPRESSION: 1. No CT evidence for acute intracranial abnormality.
2. Straightening of the cervical spine. No acute osseous
abnormality.

## 2022-12-25 ENCOUNTER — Other Ambulatory Visit: Payer: Self-pay | Admitting: Obstetrics and Gynecology

## 2022-12-25 DIAGNOSIS — R1011 Right upper quadrant pain: Secondary | ICD-10-CM

## 2022-12-28 ENCOUNTER — Ambulatory Visit
Admission: RE | Admit: 2022-12-28 | Discharge: 2022-12-28 | Disposition: A | Discharge status: Home / Self Care | Payer: 59 | Source: Ambulatory Visit | Attending: Obstetrics and Gynecology | Admitting: Obstetrics and Gynecology

## 2022-12-28 DIAGNOSIS — R1011 Right upper quadrant pain: Secondary | ICD-10-CM

## 2023-01-02 ENCOUNTER — Telehealth: Payer: Self-pay | Admitting: Internal Medicine

## 2023-01-02 DIAGNOSIS — R17 Unspecified jaundice: Secondary | ICD-10-CM

## 2023-01-02 DIAGNOSIS — R1011 Right upper quadrant pain: Secondary | ICD-10-CM

## 2023-01-02 NOTE — Telephone Encounter (Signed)
Patient known to me but not yet seen in the office 2-3 weeks of RUQ pain, radiating to back, crescendo decrescendo type pain Labs reviewed and t bili is up from baseline (1.8 with baseline 0.9), ast, alt and alk phos normal RUQ [place] questions heterogenous liver parenchyma but normal appearing GB and ducts Also undergoing eval with Dr. Rana Snare for post-menopausal uterine bleeding with uterine polyps. Diagnostic DandC scheduled for 01/22/23  A/P Suspicious for biliary pain, gallbladder dyskinesia   STAT HIDA scan with CCK or stimulation If neg then MRI abd with and without contrast

## 2023-01-04 ENCOUNTER — Other Ambulatory Visit: Payer: Self-pay

## 2023-01-04 DIAGNOSIS — R17 Unspecified jaundice: Secondary | ICD-10-CM

## 2023-01-04 DIAGNOSIS — R1011 Right upper quadrant pain: Secondary | ICD-10-CM

## 2023-01-04 NOTE — Telephone Encounter (Signed)
Order entered in epic, awaiting insurance approval.

## 2023-01-04 NOTE — Telephone Encounter (Signed)
Pt scheduled for HIDA scan at [facility] 01/05/23 at 11:30am, pt to arrive there at 11am and be NPO after midnight. Pt aware of appt.

## 2023-01-05 ENCOUNTER — Encounter (HOSPITAL_COMMUNITY)
Admission: RE | Admit: 2023-01-05 | Discharge: 2023-01-05 | Disposition: A | Discharge status: Home / Self Care | Payer: 59 | Source: Ambulatory Visit | Attending: Internal Medicine | Admitting: Internal Medicine

## 2023-01-05 ENCOUNTER — Encounter (HOSPITAL_COMMUNITY): Payer: Self-pay

## 2023-01-05 DIAGNOSIS — R1011 Right upper quadrant pain: Secondary | ICD-10-CM | POA: Insufficient documentation

## 2023-01-05 DIAGNOSIS — R17 Unspecified jaundice: Secondary | ICD-10-CM | POA: Diagnosis present

## 2023-01-05 MED ORDER — TECHNETIUM TC 99M MEBROFENIN IV KIT
5.1000 | PACK | Freq: Once | INTRAVENOUS | Status: AC
  Administered 2023-01-05: 5.1 via INTRAVENOUS

## 2023-01-13 ENCOUNTER — Other Ambulatory Visit: Payer: Self-pay | Admitting: General Surgery

## 2023-01-22 ENCOUNTER — Other Ambulatory Visit: Payer: Self-pay | Admitting: Obstetrics and Gynecology

## 2023-01-26 LAB — SURGICAL PATHOLOGY

## 2023-02-26 NOTE — Progress Notes (Signed)
Surgical Instructions   Your procedure is scheduled on Thursday March 04, 2023. Report to [facility] Main Entrance "A" at 7:30 A.M., then check in with the Admitting office. Any questions or running late day of surgery: call [phone]  Questions prior to your surgery date: call [phone], Monday-Friday, 8am-4pm. If you experience any cold or flu symptoms such as cough, fever, chills, shortness of breath, etc. between now and your scheduled surgery, please notify us at the above number.     Remember:  Do not eat after midnight the night before your surgery   You may drink clear liquids until 6:30 the morning of your surgery.   Clear liquids allowed are: Water, Non-Citrus Juices (without pulp), Carbonated Beverages, Clear Tea (no milk, honey, etc.), Black Coffee Only (NO MILK, CREAM OR POWDERED CREAMER of any kind), and Gatorade.  Patient Instructions  The night before surgery:  No food after midnight. ONLY clear liquids after midnight  The day of surgery (if you do NOT have diabetes):  Drink ONE (1) Pre-Surgery Clear Ensure by 6:30 the morning of surgery. Drink in one sitting. Do not sip.  This drink was given to you during your hospital  pre-op appointment visit.  Nothing else to drink after completing the  Pre-Surgery Clear Ensure.         If you have questions, please contact your surgeon's office.    Take these medicines the morning of surgery with A SIP OF WATER: None  One week prior to surgery, STOP taking any Aspirin (unless otherwise instructed by your surgeon) Aleve, Naproxen, Ibuprofen, Motrin, Advil, Goody's, BC's, all herbal medications, fish oil, and non-prescription vitamins.                     Do NOT Smoke (Tobacco/Vaping) for 24 hours prior to your procedure.  If you use a CPAP at night, you may bring your mask/headgear for your overnight stay.   You will be asked to remove any contacts, glasses, piercing's, hearing aid's, dentures/partials prior to  surgery. Please bring cases for these items if needed.    Patients discharged the day of surgery will not be allowed to drive home, and someone needs to stay with them for 24 hours.  SURGICAL WAITING ROOM VISITATION Patients may have no more than 2 support people in the waiting area - these visitors may rotate.   Pre-op nurse will coordinate an appropriate time for 1 ADULT support person, who may not rotate, to accompany patient in pre-op.  Children under the age of 30 must have an adult with them who is not the patient and must remain in the main waiting area with an adult.  If the patient needs to stay at the hospital during part of their recovery, the visitor guidelines for inpatient rooms apply.  Please refer to the [facility] website for the visitor guidelines for any additional information.   If you received a COVID test during your pre-op visit  it is requested that you wear a mask when out in public, stay away from anyone that may not be feeling well and notify your surgeon if you develop symptoms. If you have been in contact with anyone that has tested positive in the last 10 days please notify you surgeon.      Pre-operative CHG Bathing Instructions   You can play a key role in reducing the risk of infection after surgery. Your skin needs to be as free of germs as possible. You can reduce  the number of germs on your skin by washing with CHG (chlorhexidine gluconate) soap before surgery. CHG is an antiseptic soap that kills germs and continues to kill germs even after washing.   DO NOT use if you have an allergy to chlorhexidine/CHG or antibacterial soaps. If your skin becomes reddened or irritated, stop using the CHG and notify one of our RNs at [phone].              TAKE A SHOWER THE NIGHT BEFORE SURGERY AND THE DAY OF SURGERY    Please keep in mind the following:  DO NOT shave, including legs and underarms, 48 hours prior to surgery.   You may shave your face before/day  of surgery.  Place clean sheets on your bed the night before surgery Use a clean washcloth (not used since being washed) for each shower. DO NOT sleep with pet's night before surgery.  CHG Shower Instructions:  Wash your face and private area with normal soap. If you choose to wash your hair, wash first with your normal shampoo.  After you use shampoo/soap, rinse your hair and body thoroughly to remove shampoo/soap residue.  Turn the water OFF and apply half the bottle of CHG soap to a CLEAN washcloth.  Apply CHG soap ONLY FROM YOUR NECK DOWN TO YOUR TOES (washing for 3-5 minutes)  DO NOT use CHG soap on face, private areas, open wounds, or sores.  Pay special attention to the area where your surgery is being performed.  If you are having back surgery, having someone wash your back for you may be helpful. Wait 2 minutes after CHG soap is applied, then you may rinse off the CHG soap.  Pat dry with a clean towel  Put on clean pajamas    Additional instructions for the day of surgery: DO NOT APPLY any lotions, deodorants or perfumes.   Do not wear jewelry or makeup Do not wear nail polish, gel polish, artificial nails, or any other type of covering on natural nails (fingers and toes) Do not bring valuables to the hospital. [facility] is not responsible for valuables/personal belongings. Put on clean/comfortable clothes.  Please brush your teeth.  Ask your nurse before applying any prescription medications to the skin.

## 2023-03-01 ENCOUNTER — Encounter (HOSPITAL_COMMUNITY): Payer: Self-pay

## 2023-03-01 ENCOUNTER — Encounter (HOSPITAL_COMMUNITY)
Admission: RE | Admit: 2023-03-01 | Discharge: 2023-03-01 | Disposition: A | Discharge status: Home / Self Care | Payer: 59 | Source: Ambulatory Visit | Attending: General Surgery | Admitting: General Surgery

## 2023-03-01 ENCOUNTER — Other Ambulatory Visit: Payer: Self-pay

## 2023-03-01 VITALS — BP 128/71 | HR 73 | Temp 98.1°F | Resp 16 | Ht 64.0 in | Wt 136.2 lb

## 2023-03-01 DIAGNOSIS — Z01818 Encounter for other preprocedural examination: Secondary | ICD-10-CM | POA: Diagnosis present

## 2023-03-01 DIAGNOSIS — Z01812 Encounter for preprocedural laboratory examination: Secondary | ICD-10-CM | POA: Diagnosis not present

## 2023-03-01 LAB — CBC
HCT: 40.4 % (ref 36.0–46.0)
Hemoglobin: 13.6 g/dL (ref 12.0–15.0)
MCH: 31.6 pg (ref 26.0–34.0)
MCHC: 33.7 g/dL (ref 30.0–36.0)
MCV: 93.7 fL (ref 80.0–100.0)
Platelets: 206 10*3/uL (ref 150–400)
RBC: 4.31 MIL/uL (ref 3.87–5.11)
RDW: 12.1 % (ref 11.5–15.5)
WBC: 4.8 10*3/uL (ref 4.0–10.5)
nRBC: 0 % (ref 0.0–0.2)

## 2023-03-01 NOTE — Progress Notes (Signed)
PCP - Dr. Antony Haste Cardiologist - Denies  PPM/ICD - Denies Device Orders - n/a Rep Notified - n/a  Chest x-ray - n/a EKG - Denies Stress Test - Denies ECHO - Denies Cardiac Cath - Denies  Sleep Study - Denies CPAP - n/a  No DM  Last dose of GLP1 agonist- n/a GLP1 instructions: n/a  Blood Thinner Instructions: n/a Aspirin Instructions: n/a  ERAS Protcol - Clear liquids until 0630 morning of surgery PRE-SURGERY Ensure or G2- Ensure given to pt with instructions  COVID TEST- n/a   Anesthesia review: No.   Patient denies shortness of breath, fever, cough and chest pain at PAT appointment. Pt denies any respiratory illness/infection in the last two months.   All instructions explained to the patient, with a verbal understanding of the material. Patient agrees to go over the instructions while at home for a better understanding. Patient also instructed to self quarantine after being tested for COVID-19. The opportunity to ask questions was provided.

## 2023-03-03 NOTE — H&P (Signed)
  This is a 48 year old otherwise healthy female who presents with right upper quadrant pain x 1 month. This is right upper quadrant and it goes straight through to her back. This is intermittent. She eats very healthy and is not really associated with any particular food. She is having a lot of bloating as well as some nausea. She has no emesis. She has had less frequent bowel movements during this time. She was sent to get an ultrasound which showed a diffusely heterogeneous liver and no real abnormality of her gallbladder. She has no stones or sludge and she had a bile duct of 3 mm. She had a total bilirubin of 1.4 that I found in the computer. She then underwent a HIDA scan. She had a patent cystic duct and a common bile duct. Upon administration of a fatty meal the gallbladder failed to contract at all. Her calculated ejection fraction is 0%. She is here today to discuss her options.  Review of Systems: A complete review of systems was obtained from the patient. I have reviewed this information and discussed as appropriate with the patient. See HPI as well for other ROS.  Review of Systems  Gastrointestinal: Positive for abdominal pain.  All other systems reviewed and are negative.   Medical History: History reviewed. No pertinent past medical history.   Past Surgical History:  Procedure Laterality Date  tubes tide   Allergies  Allergen Reactions  Codeine Nausea, Nausea And Vomiting and Vomiting   No current outpatient medications on file prior to visit.   History reviewed. No pertinent family history.   Social History   Tobacco Use  Smoking Status Never  Smokeless Tobacco Never  Marital status: Married  Tobacco Use  Smoking status: Never  Smokeless tobacco: Never  Substance and Sexual Activity  Alcohol use: Yes  Alcohol/week: 2.0 - 4.0 standard drinks of alcohol  Types: 2 - 4 Standard drinks or equivalent per week  Drug use: Never   Objective:   Vitals:  01/13/23  1515  BP: 127/79  Pulse: 61  Temp: 36.7 C (98 F)  SpO2: 99%  Weight: 62.6 kg (138 lb)  Height: 162.6 cm (5\' 4" )   Body mass index is 23.69 kg/m.  Physical Exam Vitals reviewed.  Constitutional:  Appearance: Normal appearance.  Abdominal:  Palpations: Abdomen is soft.  Tenderness: There is abdominal tenderness in the right upper quadrant.  Hernia: No hernia is present.  Neurological:  Mental Status: She is alert.   Assessment and Plan:   Biliary dyskinesia  Lap chole with ICG dye  I do think her symptoms are biliary in nature. With a HIDA scan and the symptoms as well as her exam I think proceeding with a laparoscopic cholecystectomy is very reasonable. I discussed the procedure in detail. We discussed the risks and benefits of a laparoscopic cholecystectomy and possible cholangiogram including, but not limited to bleeding, infection, injury to surrounding structures such as the intestine or liver, bile leak, retained gallstones, need to convert to an open procedure, prolonged diarrhea, blood clots such as DVT, common bile duct injury, anesthesia risks, and possible need for additional procedures. The likelihood of improvement in symptoms and return to the patient's normal status is good. We discussed the typical post-operative recovery course

## 2023-03-04 ENCOUNTER — Other Ambulatory Visit: Payer: Self-pay

## 2023-03-04 ENCOUNTER — Encounter (HOSPITAL_COMMUNITY)
Admission: RE | Disposition: A | Discharge status: Home / Self Care | Payer: Self-pay | Source: Home / Self Care | Attending: General Surgery

## 2023-03-04 ENCOUNTER — Encounter (HOSPITAL_COMMUNITY): Payer: Self-pay | Admitting: General Surgery

## 2023-03-04 ENCOUNTER — Ambulatory Visit (HOSPITAL_COMMUNITY): Payer: 59 | Admitting: Anesthesiology

## 2023-03-04 ENCOUNTER — Ambulatory Visit (HOSPITAL_COMMUNITY)
Admission: RE | Admit: 2023-03-04 | Discharge: 2023-03-04 | Disposition: A | Discharge status: Home / Self Care | Payer: 59 | Attending: General Surgery | Admitting: General Surgery

## 2023-03-04 DIAGNOSIS — K828 Other specified diseases of gallbladder: Secondary | ICD-10-CM | POA: Diagnosis present

## 2023-03-04 DIAGNOSIS — K429 Umbilical hernia without obstruction or gangrene: Secondary | ICD-10-CM | POA: Diagnosis not present

## 2023-03-04 DIAGNOSIS — K811 Chronic cholecystitis: Secondary | ICD-10-CM | POA: Diagnosis not present

## 2023-03-04 HISTORY — PX: UMBILICAL HERNIA REPAIR: SHX196

## 2023-03-04 HISTORY — PX: CHOLECYSTECTOMY: SHX55

## 2023-03-04 SURGERY — LAPAROSCOPIC CHOLECYSTECTOMY WITH INTRAOPERATIVE CHOLANGIOGRAM
Anesthesia: General | Site: Abdomen

## 2023-03-04 MED ORDER — SCOPOLAMINE 1 MG/3DAYS TD PT72
1.0000 | MEDICATED_PATCH | Freq: Once | TRANSDERMAL | Status: DC
  Administered 2023-03-04: 1.5 mg via TRANSDERMAL
  Filled 2023-03-04: qty 1

## 2023-03-04 MED ORDER — ACETAMINOPHEN 325 MG PO TABS: 650.0000 mg | ORAL_TABLET | ORAL | Status: DC | PRN

## 2023-03-04 MED ORDER — FENTANYL CITRATE (PF) 100 MCG/2ML IJ SOLN
INTRAMUSCULAR | Status: AC
  Administered 2023-03-04: 100 ug via INTRAVENOUS
  Filled 2023-03-04: qty 2

## 2023-03-04 MED ORDER — FENTANYL CITRATE (PF) 100 MCG/2ML IJ SOLN
INTRAMUSCULAR | Status: AC
  Filled 2023-03-04: qty 2

## 2023-03-04 MED ORDER — MIDAZOLAM HCL 2 MG/2ML IJ SOLN
INTRAMUSCULAR | Status: AC
  Filled 2023-03-04: qty 2

## 2023-03-04 MED ORDER — EPHEDRINE 5 MG/ML INJ
INTRAVENOUS | Status: AC
Start: 2023-03-04 — End: ?
  Filled 2023-03-04: qty 5

## 2023-03-04 MED ORDER — CHLORHEXIDINE GLUCONATE CLOTH 2 % EX PADS: 6.0000 | MEDICATED_PAD | Freq: Once | CUTANEOUS | Status: DC

## 2023-03-04 MED ORDER — SODIUM CHLORIDE 0.9% FLUSH: 3.0000 mL | Freq: Two times a day (BID) | INTRAVENOUS | Status: DC

## 2023-03-04 MED ORDER — ORAL CARE MOUTH RINSE
15.0000 mL | Freq: Once | OROMUCOSAL | Status: AC
Start: 2023-03-04 — End: 2023-03-04

## 2023-03-04 MED ORDER — PROPOFOL 10 MG/ML IV BOLUS
INTRAVENOUS | Status: AC
  Filled 2023-03-04: qty 20

## 2023-03-04 MED ORDER — BUPIVACAINE LIPOSOME 1.3 % IJ SUSP
INTRAMUSCULAR | Status: AC
  Filled 2023-03-04: qty 10

## 2023-03-04 MED ORDER — BUPIVACAINE LIPOSOME 1.3 % IJ SUSP
INTRAMUSCULAR | Status: DC | PRN
  Administered 2023-03-04 (×2): 5 mL via PERINEURAL

## 2023-03-04 MED ORDER — SODIUM CHLORIDE 0.9% FLUSH: 3.0000 mL | INTRAVENOUS | Status: DC | PRN

## 2023-03-04 MED ORDER — INDOCYANINE GREEN 25 MG IV SOLR
1.2500 mg | Freq: Once | INTRAVENOUS | Status: AC
  Administered 2023-03-04: 1.25 mg via INTRAVENOUS
  Filled 2023-03-04: qty 10

## 2023-03-04 MED ORDER — KETOROLAC TROMETHAMINE 15 MG/ML IJ SOLN
15.0000 mg | Freq: Once | INTRAMUSCULAR | Status: AC
  Administered 2023-03-04: 15 mg via INTRAVENOUS

## 2023-03-04 MED ORDER — MIDAZOLAM HCL 2 MG/2ML IJ SOLN
INTRAMUSCULAR | Status: AC
  Administered 2023-03-04: 2 mg via INTRAVENOUS
  Filled 2023-03-04: qty 2

## 2023-03-04 MED ORDER — DEXAMETHASONE SODIUM PHOSPHATE 10 MG/ML IJ SOLN
INTRAMUSCULAR | Status: DC | PRN
  Administered 2023-03-04: 10 mg via INTRAVENOUS

## 2023-03-04 MED ORDER — ONDANSETRON HCL 4 MG/2ML IJ SOLN
INTRAMUSCULAR | Status: DC | PRN
  Administered 2023-03-04: 4 mg via INTRAVENOUS

## 2023-03-04 MED ORDER — CEFAZOLIN SODIUM-DEXTROSE 2-4 GM/100ML-% IV SOLN
2.0000 g | INTRAVENOUS | Status: AC
  Administered 2023-03-04: 2 g via INTRAVENOUS
  Filled 2023-03-04: qty 100

## 2023-03-04 MED ORDER — SUGAMMADEX SODIUM 200 MG/2ML IV SOLN
INTRAVENOUS | Status: DC | PRN
  Administered 2023-03-04: 200 mg via INTRAVENOUS

## 2023-03-04 MED ORDER — FENTANYL CITRATE (PF) 250 MCG/5ML IJ SOLN
INTRAMUSCULAR | Status: DC | PRN
  Administered 2023-03-04 (×2): 25 ug via INTRAVENOUS
  Administered 2023-03-04: 50 ug via INTRAVENOUS

## 2023-03-04 MED ORDER — GLYCOPYRROLATE PF 0.2 MG/ML IJ SOSY
PREFILLED_SYRINGE | INTRAMUSCULAR | Status: DC | PRN
  Administered 2023-03-04: .2 mg via INTRAVENOUS

## 2023-03-04 MED ORDER — FENTANYL CITRATE (PF) 100 MCG/2ML IJ SOLN: 100.0000 ug | Freq: Once | INTRAMUSCULAR | Status: AC

## 2023-03-04 MED ORDER — FENTANYL CITRATE (PF) 250 MCG/5ML IJ SOLN
INTRAMUSCULAR | Status: AC
  Filled 2023-03-04: qty 5

## 2023-03-04 MED ORDER — BUPIVACAINE HCL (PF) 0.25 % IJ SOLN
INTRAMUSCULAR | Status: DC | PRN
  Administered 2023-03-04 (×2): 20 mL via PERINEURAL

## 2023-03-04 MED ORDER — LIDOCAINE 2% (20 MG/ML) 5 ML SYRINGE
INTRAMUSCULAR | Status: DC | PRN
  Administered 2023-03-04: 80 mg via INTRAVENOUS

## 2023-03-04 MED ORDER — SPY AGENT GREEN - (INDOCYANINE FOR INJECTION): 1.2500 mg | Freq: Once | INTRAMUSCULAR | Status: DC

## 2023-03-04 MED ORDER — MIDAZOLAM HCL 2 MG/2ML IJ SOLN: 2.0000 mg | Freq: Once | INTRAMUSCULAR | Status: AC

## 2023-03-04 MED ORDER — BUPIVACAINE-EPINEPHRINE 0.25% -1:200000 IJ SOLN
INTRAMUSCULAR | Status: DC | PRN
  Administered 2023-03-04: 4 mL

## 2023-03-04 MED ORDER — BUPIVACAINE-EPINEPHRINE (PF) 0.25% -1:200000 IJ SOLN
INTRAMUSCULAR | Status: AC
  Filled 2023-03-04: qty 30

## 2023-03-04 MED ORDER — OXYCODONE HCL 5 MG PO TABS: 5.0000 mg | ORAL_TABLET | ORAL | Status: DC | PRN

## 2023-03-04 MED ORDER — EPHEDRINE SULFATE (PRESSORS) 50 MG/ML IJ SOLN
INTRAMUSCULAR | Status: DC | PRN
  Administered 2023-03-04 (×2): 5 mg via INTRAVENOUS

## 2023-03-04 MED ORDER — PROPOFOL 10 MG/ML IV BOLUS
INTRAVENOUS | Status: DC | PRN
  Administered 2023-03-04: 140 mg via INTRAVENOUS

## 2023-03-04 MED ORDER — MIDAZOLAM HCL 5 MG/5ML IJ SOLN
INTRAMUSCULAR | Status: DC | PRN
  Administered 2023-03-04: 1 mg via INTRAVENOUS

## 2023-03-04 MED ORDER — AMISULPRIDE (ANTIEMETIC) 5 MG/2ML IV SOLN: 10.0000 mg | Freq: Once | INTRAVENOUS | Status: DC | PRN

## 2023-03-04 MED ORDER — TRAMADOL HCL 50 MG PO TABS: 50.0000 mg | ORAL_TABLET | Freq: Four times a day (QID) | ORAL | 0 refills | Status: DC | PRN

## 2023-03-04 MED ORDER — ACETAMINOPHEN 650 MG RE SUPP: 650.0000 mg | RECTAL | Status: DC | PRN

## 2023-03-04 MED ORDER — ROCURONIUM BROMIDE 100 MG/10ML IV SOLN
INTRAVENOUS | Status: DC | PRN
  Administered 2023-03-04: 10 mg via INTRAVENOUS
  Administered 2023-03-04: 50 mg via INTRAVENOUS

## 2023-03-04 MED ORDER — 0.9 % SODIUM CHLORIDE (POUR BTL) OPTIME
TOPICAL | Status: DC | PRN
  Administered 2023-03-04: 1000 mL

## 2023-03-04 MED ORDER — PHENYLEPHRINE 80 MCG/ML (10ML) SYRINGE FOR IV PUSH (FOR BLOOD PRESSURE SUPPORT)
PREFILLED_SYRINGE | INTRAVENOUS | Status: AC
Start: 2023-03-04 — End: ?
  Filled 2023-03-04: qty 10

## 2023-03-04 MED ORDER — ONDANSETRON HCL 4 MG/2ML IJ SOLN: 4.0000 mg | Freq: Once | INTRAMUSCULAR | Status: DC | PRN

## 2023-03-04 MED ORDER — KETOROLAC TROMETHAMINE 15 MG/ML IJ SOLN
INTRAMUSCULAR | Status: AC
  Filled 2023-03-04: qty 1

## 2023-03-04 MED ORDER — ACETAMINOPHEN 500 MG PO TABS
1000.0000 mg | ORAL_TABLET | ORAL | Status: AC
  Administered 2023-03-04: 1000 mg via ORAL
  Filled 2023-03-04: qty 2

## 2023-03-04 MED ORDER — LACTATED RINGERS IV SOLN: INTRAVENOUS | Status: DC

## 2023-03-04 MED ORDER — LACTATED RINGERS IV SOLN: INTRAVENOUS | Status: DC | PRN

## 2023-03-04 MED ORDER — FENTANYL CITRATE (PF) 100 MCG/2ML IJ SOLN
25.0000 ug | INTRAMUSCULAR | Status: DC | PRN
  Administered 2023-03-04: 25 ug via INTRAVENOUS

## 2023-03-04 MED ORDER — CHLORHEXIDINE GLUCONATE 0.12 % MT SOLN
15.0000 mL | Freq: Once | OROMUCOSAL | Status: AC
  Administered 2023-03-04: 15 mL via OROMUCOSAL
  Filled 2023-03-04: qty 15

## 2023-03-04 MED ORDER — SODIUM CHLORIDE 0.9 % IV SOLN: 250.0000 mL | INTRAVENOUS | Status: DC | PRN

## 2023-03-04 MED ORDER — ENSURE PRE-SURGERY PO LIQD: 296.0000 mL | Freq: Once | ORAL | Status: DC

## 2023-03-04 SURGICAL SUPPLY — 43 items
APPLIER CLIP 5 13 M/L LIGAMAX5 (MISCELLANEOUS) ×1
BAG COUNTER SPONGE SURGICOUNT (BAG) ×2 IMPLANT
BLADE CLIPPER SURG (BLADE) IMPLANT
CANISTER SUCT 3000ML PPV (MISCELLANEOUS) ×2 IMPLANT
CHLORAPREP W/TINT 26 (MISCELLANEOUS) ×2 IMPLANT
CLIP APPLIE 5 13 M/L LIGAMAX5 (MISCELLANEOUS) ×2 IMPLANT
CLSR STERI-STRIP ANTIMIC 1/2X4 (GAUZE/BANDAGES/DRESSINGS) IMPLANT
COVER MAYO STAND STRL (DRAPES) IMPLANT
COVER SURGICAL LIGHT HANDLE (MISCELLANEOUS) ×2 IMPLANT
DERMABOND ADVANCED .7 DNX12 (GAUZE/BANDAGES/DRESSINGS) ×2 IMPLANT
DRAPE C-ARM 42X120 X-RAY (DRAPES) IMPLANT
ELECT REM PT RETURN 9FT ADLT (ELECTROSURGICAL) ×1
ELECTRODE REM PT RTRN 9FT ADLT (ELECTROSURGICAL) ×2 IMPLANT
GLOVE BIO SURGEON STRL SZ7 (GLOVE) ×2 IMPLANT
GLOVE BIOGEL PI IND STRL 7.5 (GLOVE) ×2 IMPLANT
GOWN STRL REUS W/ TWL LRG LVL3 (GOWN DISPOSABLE) ×6 IMPLANT
GRASPER SUT TROCAR 14GX15 (MISCELLANEOUS) ×2 IMPLANT
IRRIG SUCT STRYKERFLOW 2 WTIP (MISCELLANEOUS) ×1
IRRIGATION SUCT STRKRFLW 2 WTP (MISCELLANEOUS) ×2 IMPLANT
KIT BASIN OR (CUSTOM PROCEDURE TRAY) ×2 IMPLANT
KIT IMAGING PINPOINTPAQ (MISCELLANEOUS) IMPLANT
KIT TURNOVER KIT B (KITS) ×2 IMPLANT
NS IRRIG 1000ML POUR BTL (IV SOLUTION) ×2 IMPLANT
PAD ARMBOARD 7.5X6 YLW CONV (MISCELLANEOUS) ×2 IMPLANT
PENCIL SMOKE EVACUATOR (MISCELLANEOUS) IMPLANT
POUCH RETRIEVAL ECOSAC 10 (ENDOMECHANICALS) ×2 IMPLANT
SCISSORS LAP 5X35 DISP (ENDOMECHANICALS) ×2 IMPLANT
SET CHOLANGIOGRAPH 5 50 .035 (SET/KITS/TRAYS/PACK) IMPLANT
SET TUBE SMOKE EVAC HIGH FLOW (TUBING) ×2 IMPLANT
SLEEVE Z-THREAD 5X100MM (TROCAR) ×4 IMPLANT
SPECIMEN JAR SMALL (MISCELLANEOUS) ×2 IMPLANT
STRIP CLOSURE SKIN 1/2X4 (GAUZE/BANDAGES/DRESSINGS) ×2 IMPLANT
SUT MNCRL AB 4-0 PS2 18 (SUTURE) ×2 IMPLANT
SUT VIC AB 0 UR5 27 (SUTURE) IMPLANT
SUT VIC AB 3-0 SH 27X BRD (SUTURE) IMPLANT
SUT VICRYL 0 UR6 27IN ABS (SUTURE) ×2 IMPLANT
TOWEL GREEN STERILE (TOWEL DISPOSABLE) ×2 IMPLANT
TOWEL GREEN STERILE FF (TOWEL DISPOSABLE) ×2 IMPLANT
TRAY LAPAROSCOPIC MC (CUSTOM PROCEDURE TRAY) ×2 IMPLANT
TROCAR BALLN 12MMX100 BLUNT (TROCAR) ×2 IMPLANT
TROCAR Z-THREAD OPTICAL 5X100M (TROCAR) ×2 IMPLANT
WARMER LAPAROSCOPE (MISCELLANEOUS) ×2 IMPLANT
WATER STERILE IRR 1000ML POUR (IV SOLUTION) ×2 IMPLANT

## 2023-03-04 NOTE — Interval H&P Note (Signed)
History and Physical Interval Note:  03/04/2023 8:41 AM  [name]  has presented today for surgery, with the diagnosis of RIGHT UPPER QUADRANT PAIN.  The various methods of treatment have been discussed with the patient and family. After consideration of risks, benefits and other options for treatment, the patient has consented to  Procedure(s) with comments: LAPAROSCOPIC CHOLECYSTECTOMY WITH ICG DYE (N/A) - GEN/TAP BLOCK as a surgical intervention.  The patient's history has been reviewed, patient examined, no change in status, stable for surgery.  I have reviewed the patient's chart and labs.  Questions were answered to the patient's satisfaction.     Emelia Loron

## 2023-03-04 NOTE — Transfer of Care (Signed)
Immediate Anesthesia Transfer of Care Note  Patient: LABELLE TREMONTI  Procedure(s) Performed: LAPAROSCOPIC CHOLECYSTECTOMY WITH ICG DYE, (Abdomen) HERNIA REPAIR UMBILICAL ADULT (Abdomen)  Patient Location: PACU  Anesthesia Type:General  Level of Consciousness: awake  Airway & Oxygen Therapy: Patient Spontanous Breathing  Post-op Assessment: Report given to RN  Post vital signs: stable  Last Vitals:  Vitals Value Taken Time  BP 134/86 03/04/23 1038  Temp    Pulse 81 03/04/23 1039  Resp 12 03/04/23 1039  SpO2 97 % 03/04/23 1039  Vitals shown include unfiled device data.  Last Pain:  Vitals:   03/04/23 0814  TempSrc:   PainSc: 0-No pain         Complications: No notable events documented.

## 2023-03-04 NOTE — Anesthesia Procedure Notes (Signed)
Anesthesia Regional Block: TAP block   Pre-Anesthetic Checklist: , timeout performed,  Correct Patient, Correct Site, Correct Laterality,  Correct Procedure, Correct Position, site marked,  Risks and benefits discussed,  Surgical consent,  Pre-op evaluation,  At surgeon's request and post-op pain management  Laterality: Left  Prep: chloraprep       Needles:  Injection technique: Single-shot  Needle Type: Echogenic Needle     Needle Length: 9cm  Needle Gauge: 21     Additional Needles:   Procedures:,,,, ultrasound used (permanent image in chart),,    Narrative:  Start time: 03/04/2023 8:52 AM End time: 03/04/2023 8:57 AM Injection made incrementally with aspirations every 5 mL.  Performed by: Personally  Anesthesiologist: Collene Schlichter, MD  Additional Notes: No pain on injection. No increased resistance to injection. Injection made in 5cc increments.  Good needle visualization.  Patient tolerated procedure well.

## 2023-03-04 NOTE — Anesthesia Procedure Notes (Signed)
Anesthesia Regional Block: TAP block   Pre-Anesthetic Checklist: , timeout performed,  Correct Patient, Correct Site, Correct Laterality,  Correct Procedure, Correct Position, site marked,  Risks and benefits discussed,  Surgical consent,  Pre-op evaluation,  At surgeon's request and post-op pain management  Laterality: Right  Prep: chloraprep       Needles:  Injection technique: Single-shot  Needle Type: Echogenic Needle     Needle Length: 9cm  Needle Gauge: 21     Additional Needles:   Procedures:,,,, ultrasound used (permanent image in chart),,    Narrative:  Start time: 03/04/2023 8:57 AM End time: 03/04/2023 9:02 AM Injection made incrementally with aspirations every 5 mL.  Performed by: Personally  Anesthesiologist: Collene Schlichter, MD  Additional Notes: No pain on injection. No increased resistance to injection. Injection made in 5cc increments.  Good needle visualization.  Patient tolerated procedure well.

## 2023-03-04 NOTE — Anesthesia Postprocedure Evaluation (Signed)
Anesthesia Post Note  Patient: [name]  Procedure(s) Performed: LAPAROSCOPIC CHOLECYSTECTOMY WITH ICG DYE, (Abdomen) HERNIA REPAIR UMBILICAL ADULT (Abdomen)     Patient location during evaluation: PACU Anesthesia Type: General Level of consciousness: awake and alert Pain management: pain level controlled Vital Signs Assessment: post-procedure vital signs reviewed and stable Respiratory status: spontaneous breathing, nonlabored ventilation and respiratory function stable Cardiovascular status: blood pressure returned to baseline and stable Postop Assessment: no apparent nausea or vomiting Anesthetic complications: no   No notable events documented.  Last Vitals:  Vitals:   03/04/23 1145 03/04/23 1200  BP: 122/78 121/72  Pulse: 72 79  Resp: 14 17  Temp:  36.9 C  SpO2: 97% 97%    Last Pain:  Vitals:   03/04/23 1130  TempSrc:   PainSc: 7                  Collene Schlichter

## 2023-03-04 NOTE — Anesthesia Procedure Notes (Signed)
Procedure Name: Intubation Date/Time: 03/04/2023 9:33 AM  Performed by: Halina Andreas, CRNAPre-anesthesia Checklist: Patient identified, Emergency Drugs available, Suction available, Patient being monitored and Timeout performed Patient Re-evaluated:Patient Re-evaluated prior to induction Oxygen Delivery Method: Circle system utilized Preoxygenation: Pre-oxygenation with 100% oxygen Induction Type: IV induction Ventilation: Mask ventilation without difficulty Laryngoscope Size: Miller and 2 Grade View: Grade I Tube type: Oral Tube size: 7.0 mm Number of attempts: 1

## 2023-03-04 NOTE — Anesthesia Preprocedure Evaluation (Addendum)
Anesthesia Evaluation  Patient identified by MRN, date of birth, ID band Patient awake    Reviewed: Allergy & Precautions, NPO status , Patient's Chart, lab work & pertinent test results  Airway Mallampati: II  TM Distance: >3 FB Neck ROM: Full    Dental  (+) Teeth Intact, Dental Advisory Given   Pulmonary neg pulmonary ROS   Pulmonary exam normal breath sounds clear to auscultation       Cardiovascular Exercise Tolerance: Good negative cardio ROS Normal cardiovascular exam Rhythm:Regular Rate:Normal     Neuro/Psych negative neurological ROS  negative psych ROS   GI/Hepatic negative GI ROS,,,RIGHT UPPER QUADRANT PAIN   Endo/Other  negative endocrine ROS    Renal/GU negative Renal ROS     Musculoskeletal negative musculoskeletal ROS (+)    Abdominal   Peds  Hematology negative hematology ROS (+)   Anesthesia Other Findings Day of surgery medications reviewed with the patient.  Reproductive/Obstetrics negative OB ROS                             Anesthesia Physical Anesthesia Plan  ASA: 2  Anesthesia Plan: General   Post-op Pain Management: Tylenol PO (pre-op)* and Regional block*   Induction: Intravenous  PONV Risk Score and Plan: 4 or greater and Midazolam, Dexamethasone, Ondansetron and Scopolamine patch - Pre-op  Airway Management Planned: Oral ETT  Additional Equipment:   Intra-op Plan:   Post-operative Plan: Extubation in OR  Informed Consent: I have reviewed the patients History and Physical, chart, labs and discussed the procedure including the risks, benefits and alternatives for the proposed anesthesia with the patient or authorized representative who has indicated his/her understanding and acceptance.     Dental advisory given  Plan Discussed with: CRNA  Anesthesia Plan Comments:         Anesthesia Quick Evaluation

## 2023-03-04 NOTE — Discharge Instructions (Signed)

## 2023-03-04 NOTE — Op Note (Addendum)
Preoperative diagnosis: Biliary dyskinesia Postoperative diagnosis: Same as above Procedure: Laparoscopic cholecystectomy, primary umbilical hernia repair  Surgeon: Dr. Harden Mo Anesthesia: General Estimated blood loss: Minimal Complications: None Drains: None Specimens: Gallbladder and contents to pathology Sponge needle count was correct at completion Disposition recovery in stable condition     Indications: 48 year old otherwise healthy female who presents with right upper quadrant pain x 1 month. This is right upper quadrant and it goes straight through to her back. This is intermittent. She eats very healthy and is not really associated with any particular food. She is having a lot of bloating as well as some nausea. She has no emesis. She has had less frequent bowel movements during this time. She was sent to get an ultrasound which showed a diffusely heterogeneous liver and no real abnormality of her gallbladder. She has no stones or sludge and she had a bile duct of 3 mm.She then underwent a HIDA scan. She had a patent cystic duct and a common bile duct. Upon administration of a fatty meal the gallbladder failed to contract at all. Her calculated ejection fraction is 0%.  We discussed lap chole.    Procedure: After informed consent was obtained she was taken to the operating room.  She was given antibiotics.  SCDs were in place.  She was given ICG dye.  She was placed under general anesthesia without complication.  She was prepped and draped in a standard sterile surgical fashion.  A surgical timeout was then performed.   I infiltrated Marcaine below her umbilicus.  I made an infraumbilical incision.  I grasped the fascia and incised this sharply. She had a 3 mm umbilical hernia. I enlarged this a little and excised the preperitoneal fat incarcerated in it.   I placed a 0 Vicryl pursestring suture through the fascia and inserted a Hassan trocar.  I insufflated the abdomen to 15 mmHg  pressure.  I then inserted 3 additional 5 mm trocars in the epigastrium and right upper quadrant without difficulty.  The gallbladder had evidence of inflammation. The gallbladder was then retracted cephalad and lateral.  I was able to dissect in the triangle and clearly obtained a wide critical view of safety.  I did use the ICG dye and this confirmed that I was well away from the bile duct.   I then clipped the arteryand divided it leaving 2 clips in place.  I then placed two clips proximally and one distally on the cystic duct. I divided the cystic duct. The clips completely traversed the duct and the duct was viable.  I then removed the gallbladder from the liver bed.  This was placed in a retrieval bag and removed.  I obtained hemostasis.    I then removed the [facility] trocar and tied the pursestring down.  I placed an additional 2 0 Vicryl suture through the fascia and completely obliterated the umbilical hernia defect.  The remaining trocars were then removed and the abdomen desufflated.  These were closed with 4-0 Monocryl and glue.  She tolerated it well was extubated and transferred recovery stable.

## 2023-03-05 ENCOUNTER — Encounter (HOSPITAL_COMMUNITY): Payer: Self-pay | Admitting: General Surgery

## 2023-03-05 LAB — SURGICAL PATHOLOGY

## 2023-03-10 ENCOUNTER — Other Ambulatory Visit: Payer: Self-pay

## 2023-03-10 ENCOUNTER — Encounter (HOSPITAL_COMMUNITY): Payer: Self-pay

## 2023-03-10 ENCOUNTER — Emergency Department (HOSPITAL_COMMUNITY)
Admission: EM | Admit: 2023-03-10 | Discharge: 2023-03-10 | Disposition: A | Discharge status: Home / Self Care | Payer: 59 | Attending: Emergency Medicine | Admitting: Emergency Medicine

## 2023-03-10 ENCOUNTER — Emergency Department (HOSPITAL_COMMUNITY): Payer: 59

## 2023-03-10 DIAGNOSIS — G8918 Other acute postprocedural pain: Secondary | ICD-10-CM | POA: Insufficient documentation

## 2023-03-10 DIAGNOSIS — R109 Unspecified abdominal pain: Secondary | ICD-10-CM | POA: Diagnosis not present

## 2023-03-10 LAB — CBC WITH DIFFERENTIAL/PLATELET
Abs Immature Granulocytes: 0.02 10*3/uL (ref 0.00–0.07)
Basophils Absolute: 0 10*3/uL (ref 0.0–0.1)
Basophils Relative: 1 %
Eosinophils Absolute: 0.3 10*3/uL (ref 0.0–0.5)
Eosinophils Relative: 6 %
HCT: 39 % (ref 36.0–46.0)
Hemoglobin: 13.2 g/dL (ref 12.0–15.0)
Immature Granulocytes: 0 %
Lymphocytes Relative: 34 %
Lymphs Abs: 1.9 10*3/uL (ref 0.7–4.0)
MCH: 31.3 pg (ref 26.0–34.0)
MCHC: 33.8 g/dL (ref 30.0–36.0)
MCV: 92.4 fL (ref 80.0–100.0)
Monocytes Absolute: 0.5 10*3/uL (ref 0.1–1.0)
Monocytes Relative: 8 %
Neutro Abs: 2.8 10*3/uL (ref 1.7–7.7)
Neutrophils Relative %: 51 %
Platelets: 196 10*3/uL (ref 150–400)
RBC: 4.22 MIL/uL (ref 3.87–5.11)
RDW: 12 % (ref 11.5–15.5)
WBC: 5.6 10*3/uL (ref 4.0–10.5)
nRBC: 0 % (ref 0.0–0.2)

## 2023-03-10 LAB — COMPREHENSIVE METABOLIC PANEL
ALT: 15 U/L (ref 0–44)
AST: 13 U/L — ABNORMAL LOW (ref 15–41)
Albumin: 4.1 g/dL (ref 3.5–5.0)
Alkaline Phosphatase: 33 U/L — ABNORMAL LOW (ref 38–126)
Anion gap: 10 (ref 5–15)
BUN: 12 mg/dL (ref 6–20)
CO2: 26 mmol/L (ref 22–32)
Calcium: 9.5 mg/dL (ref 8.9–10.3)
Chloride: 103 mmol/L (ref 98–111)
Creatinine, Ser: 0.81 mg/dL (ref 0.44–1.00)
GFR, Estimated: >60 mL/min (ref >60)
Glucose, Bld: 104 mg/dL — ABNORMAL HIGH (ref 70–99)
Potassium: 4.4 mmol/L (ref 3.5–5.1)
Sodium: 139 mmol/L (ref 135–145)
Total Bilirubin: 1.4 mg/dL — ABNORMAL HIGH (ref 0.0–1.2)
Total Protein: 6.5 g/dL (ref 6.5–8.1)

## 2023-03-10 LAB — LIPASE, BLOOD: Lipase: 27 U/L (ref 11–51)

## 2023-03-10 LAB — HCG, QUANTITATIVE, PREGNANCY: hCG, Beta Chain, Quant, S: 2 m[IU]/mL (ref <5)

## 2023-03-10 MED ORDER — METHOCARBAMOL 500 MG PO TABS
750.0000 mg | ORAL_TABLET | Freq: Once | ORAL | Status: AC
  Administered 2023-03-10: 500 mg via ORAL
  Filled 2023-03-10: qty 2

## 2023-03-10 MED ORDER — ONDANSETRON 4 MG PO TBDP: 4.0000 mg | ORAL_TABLET | Freq: Three times a day (TID) | ORAL | 0 refills | Status: DC | PRN

## 2023-03-10 MED ORDER — TRAMADOL HCL 50 MG PO TABS: 50.0000 mg | ORAL_TABLET | Freq: Four times a day (QID) | ORAL | 0 refills | Status: DC | PRN

## 2023-03-10 MED ORDER — IOHEXOL 9 MG/ML PO SOLN
500.0000 mL | ORAL | Status: AC
  Administered 2023-03-10 (×2): 500 mL via ORAL

## 2023-03-10 MED ORDER — KETOROLAC TROMETHAMINE 30 MG/ML IJ SOLN
30.0000 mg | Freq: Once | INTRAMUSCULAR | Status: DC
  Filled 2023-03-10: qty 1

## 2023-03-10 MED ORDER — DEXTROSE IN LACTATED RINGERS 5 % IV SOLN
INTRAVENOUS | Status: DC
Start: 2023-03-10 — End: 2023-03-10

## 2023-03-10 MED ORDER — TRAMADOL HCL 50 MG PO TABS
100.0000 mg | ORAL_TABLET | Freq: Once | ORAL | Status: AC
  Administered 2023-03-10: 50 mg via ORAL
  Filled 2023-03-10: qty 2

## 2023-03-10 MED ORDER — TRAMADOL HCL 50 MG PO TABS
50.0000 mg | ORAL_TABLET | Freq: Once | ORAL | Status: AC
  Administered 2023-03-10: 50 mg via ORAL
  Filled 2023-03-10: qty 1

## 2023-03-10 MED ORDER — IOHEXOL 350 MG/ML SOLN
75.0000 mL | Freq: Once | INTRAVENOUS | Status: AC | PRN
  Administered 2023-03-10: 75 mL via INTRAVENOUS

## 2023-03-10 MED ORDER — METHOCARBAMOL 750 MG PO TABS: 750.0000 mg | ORAL_TABLET | Freq: Three times a day (TID) | ORAL | 0 refills | Status: DC | PRN

## 2023-03-10 MED ORDER — ONDANSETRON HCL 4 MG/2ML IJ SOLN
4.0000 mg | Freq: Once | INTRAMUSCULAR | Status: DC
  Filled 2023-03-10: qty 2

## 2023-03-10 NOTE — ED Notes (Signed)
Pt only wanted 50mg  of tramadol and 500mg  of robaxin. Wasted with Statistician.

## 2023-03-10 NOTE — Consult Note (Addendum)
Reason for Consult:ab pain s/p lap chole  [name] is an 48 y.o. female.  HPI: 49 yof s/p lap chole 1/23 for biliary dyskinesia.  Uncomplicated procedure with ICG dye used for identification of anatomy.  Pathology was chronic cholecystitis. She had no stones.  She has been doing ok. Not much appetite postop.   Had a couple bms.  Overnight had onset of worse pain in ruq and right flank with nausea, no emesis.  Presented to er and found to have nl alt, ast and alk phos lower than normal range, TB 1.4 (normal 1.2).    Past Medical History:  Diagnosis Date   Ectopic pregnancy     Past Surgical History:  Procedure Laterality Date   CHOLECYSTECTOMY N/A 03/04/2023   Procedure: LAPAROSCOPIC CHOLECYSTECTOMY WITH ICG DYE,;  Surgeon: Emelia Loron, MD;  Location: [facility] OR;  Service: General;  Laterality: N/A;  GEN/TAP BLOCK   DILATION AND CURETTAGE OF UTERUS     SALPINGECTOMY     Due to ectopic pregnancy   UMBILICAL HERNIA REPAIR N/A 03/04/2023   Procedure: HERNIA REPAIR UMBILICAL ADULT;  Surgeon: Emelia Loron, MD;  Location: [facility] OR;  Service: General;  Laterality: N/A;    History reviewed. No pertinent family history.  Social History:  reports that she has never smoked. She has never used smokeless tobacco. She reports current alcohol use. She reports that she does not use drugs.  Allergies:  Allergies  Allergen Reactions   Codeine Nausea And Vomiting    Medications: I have reviewed the patient's current medications.  Results for orders placed or performed during the hospital encounter of 03/10/23 (from the past 48 hours)  Lipase, blood     Status: None   Collection Time: 03/10/23  7:03 AM  Result Value Ref Range   Lipase 27 11 - 51 U/L    Comment: Performed at [facility], [address] [place], [place] [address]  Comprehensive metabolic panel     Status: Abnormal   Collection Time: 03/10/23  7:03 AM  Result Value Ref Range   Sodium 139 135 - 145 mmol/L    Potassium 4.4 3.5 - 5.1 mmol/L   Chloride 103 98 - 111 mmol/L   CO2 26 22 - 32 mmol/L   Glucose, Bld 104 (H) 70 - 99 mg/dL    Comment: Glucose reference range applies only to samples taken after fasting for at least 8 hours.   BUN 12 6 - 20 mg/dL   Creatinine, Ser 1.19 0.44 - 1.00 mg/dL   Calcium 9.5 8.9 - 14.7 mg/dL   Total Protein 6.5 6.5 - 8.1 g/dL   Albumin 4.1 3.5 - 5.0 g/dL   AST 13 (L) 15 - 41 U/L   ALT 15 0 - 44 U/L   Alkaline Phosphatase 33 (L) 38 - 126 U/L   Total Bilirubin 1.4 (H) 0.0 - 1.2 mg/dL   GFR, Estimated >82 >95 mL/min    Comment: (NOTE) Calculated using the CKD-EPI Creatinine Equation (2021)    Anion gap 10 5 - 15    Comment: Performed at [facility], [address], [place], [place] [address]  CBC with Differential     Status: None   Collection Time: 03/10/23  7:03 AM  Result Value Ref Range   WBC 5.6 4.0 - 10.5 K/uL   RBC 4.22 3.87 - 5.11 MIL/uL   Hemoglobin 13.2 12.0 - 15.0 g/dL   HCT 86.5 78.4 - 69.6 %   MCV 92.4  80.0 - 100.0 fL   MCH 31.3 26.0 - 34.0 pg   MCHC 33.8 30.0 - 36.0 g/dL   RDW 46.9 62.9 - 52.8 %   Platelets 196 150 - 400 K/uL   nRBC 0.0 0.0 - 0.2 %   Neutrophils Relative % 51 %   Neutro Abs 2.8 1.7 - 7.7 K/uL   Lymphocytes Relative 34 %   Lymphs Abs 1.9 0.7 - 4.0 K/uL   Monocytes Relative 8 %   Monocytes Absolute 0.5 0.1 - 1.0 K/uL   Eosinophils Relative 6 %   Eosinophils Absolute 0.3 0.0 - 0.5 K/uL   Basophils Relative 1 %   Basophils Absolute 0.0 0.0 - 0.1 K/uL   Immature Granulocytes 0 %   Abs Immature Granulocytes 0.02 0.00 - 0.07 K/uL    Comment: Performed at [facility], [address] [place], [place] [address]  hCG, quantitative, pregnancy     Status: None   Collection Time: 03/10/23  7:10 AM  Result Value Ref Range   hCG, Beta Chain, Quant, S 2 <5 mIU/mL    Comment:          GEST. AGE      CONC.  (mIU/mL)   <=1 WEEK        5 - 50     2 WEEKS       50 - 500     3 WEEKS       100 - 10,000     4 WEEKS      1,000 - 30,000     5 WEEKS     3,500 - 115,000   6-8 WEEKS     12,000 - 270,000    12 WEEKS     15,000 - 220,000        FEMALE AND NON-PREGNANT FEMALE:     LESS THAN 5 mIU/mL Performed at [facility], [address], [place], [place] [address]     No results found.  Review of Systems  Constitutional:  Positive for fatigue.  Gastrointestinal:  Positive for abdominal pain and nausea.   Blood pressure 104/76, pulse 72, temperature (!) 97.4 F (36.3 C), resp. rate 16, height 5\' 4"  (1.626 m), weight 59 kg, last menstrual period 12/17/2021, SpO2 100%. Physical Exam Vitals reviewed.  Constitutional:      Appearance: Normal appearance.  Abdominal:     Comments: Soft approp tender incisoins clean  Neurological:     Mental Status: She is alert.     Assessment/Plan: Postop pain s/p lap chole -ct really pretty normal - I think just needs some better pain control and symptom control nothing concerning -will write for toradol and some fluids. I will put in for outpatient rx also -can go home if does fine. -please call back if does not  Emelia Loron 03/10/2023, 12:16 PM

## 2023-03-10 NOTE — ED Provider Triage Note (Signed)
Emergency Medicine Provider Triage Evaluation Note  [name] , a 48 y.o. female  was evaluated in triage.  Pt complains of abdominal pain. Had gallbladder removed on Thursday 1/23 by Dr Dwain Sarna. Was doing well, finished tramadol and was taking ibuprofen and tylenol. Started having worsening pain last night in right abdomen and right flank. Difficulty with sitting up straight due to pain. Having normal BM's, no N/V.  Review of Systems  Positive: Abdominal pain, flank pain Negative: Fever, N/V/D  Physical Exam  BP 128/80 (BP Location: Right Arm)   Pulse 82   Temp 98.3 F (36.8 C)   Resp 16   Ht 5\' 4"  (1.626 m)   Wt 59 kg   LMP 12/17/2021 (Within Months)   SpO2 99%   BMI 22.31 kg/m  Gen:   Awake, no distress   Resp:  Normal effort  MSK:   Moves extremities without difficulty  Other:  Laparoscopic incision sites well healing, do not appear infected. Abdomen overall tender to light touch, R flank tender, sitting hunched over  Medical Decision Making  Medically screening exam initiated at 7:12 AM.  Appropriate orders placed.  [name] was informed that the remainder of the evaluation will be completed by another provider, this initial triage assessment does not replace that evaluation, and the importance of remaining in the ED until their evaluation is complete.  Workup initiated including CT abdomen/pelvis. Will order one dose PO meds as patient does not currently have IV. She has tolerated tramadol after surgery, will order this.    Su Monks, [place] 03/10/23 1610

## 2023-03-10 NOTE — ED Provider Notes (Signed)
[place] EMERGENCY DEPARTMENT AT [facility] Provider Note   CSN: [id number] Arrival date & time: 03/10/23  4098     History  Chief Complaint  Patient presents with   Post-op Problem    [name] is a 48 y.o. female who recently underwent a cholecystectomy and umbilical hernia repair with Dr. Dwain Sarna on 03/04/2023, presents with concern for right sided abdominal pain that began last night.  Denies any fevers, nausea or vomiting.  Last bowel movement was yesterday which was normal.   HPI     Home Medications Prior to Admission medications   Medication Sig Start Date End Date Taking? Authorizing Provider  ibuprofen (ADVIL) 200 MG tablet Take 600 tablets by mouth every 6 (six) hours as needed for fever, headache, mild pain (pain score 1-3) or moderate pain (pain score 4-6).   Yes [provider]  methocarbamol (ROBAXIN) 750 MG tablet Take 1 tablet (750 mg total) by mouth every 8 (eight) hours as needed (use for muscle cramps/pain). 03/10/23  Yes Emelia Loron, MD  ondansetron (ZOFRAN-ODT) 4 MG disintegrating tablet Take 1 tablet (4 mg total) by mouth every 8 (eight) hours as needed for nausea or vomiting. 03/10/23  Yes Emelia Loron, MD  OVER THE COUNTER MEDICATION Take 1 Scoop by mouth daily. AG1 Greens   Yes [provider]  traMADol (ULTRAM) 50 MG tablet Take 1 tablet (50 mg total) by mouth every 6 (six) hours as needed. 03/04/23  Yes Emelia Loron, MD  traMADol (ULTRAM) 50 MG tablet Take 1 tablet (50 mg total) by mouth every 6 (six) hours as needed. 03/10/23  Yes Emelia Loron, MD      Allergies    Codeine    Review of Systems   Review of Systems  Constitutional:  Negative for fever.  Gastrointestinal:  Positive for abdominal pain.    Physical Exam Updated Vital Signs BP 120/74 (BP Location: Right Arm)   Pulse 81   Temp 98.1 F (36.7 C) (Oral)   Resp 17   Ht 5\' 4"  (1.626 m)   Wt 59 kg   LMP 12/17/2021 (Within  Months)   SpO2 100%   BMI 22.31 kg/m  Physical Exam Vitals and nursing note reviewed.  Constitutional:      Appearance: Normal appearance.  HENT:     Head: Atraumatic.  Cardiovascular:     Rate and Rhythm: Normal rate and regular rhythm.  Pulmonary:     Effort: Pulmonary effort is normal.  Abdominal:     General: Abdomen is flat. Bowel sounds are normal.     Palpations: Abdomen is soft.     Comments: Clean, dry and intact surgical incisions from lap chole with overlying steri-strips  Abdomen soft with mild tenderness palpation over the surgical sites  Neurological:     General: No focal deficit present.     Mental Status: She is alert.  Psychiatric:        Mood and Affect: Mood normal.        Behavior: Behavior normal.     ED Results / Procedures / Treatments   Labs (all labs ordered are listed, but only abnormal results are displayed) Labs Reviewed  COMPREHENSIVE METABOLIC PANEL - Abnormal; Notable for the following components:      Result Value   Glucose, Bld 104 (*)    AST 13 (*)    Alkaline Phosphatase 33 (*)    Total Bilirubin 1.4 (*)    All other components within normal limits  LIPASE, BLOOD  CBC WITH DIFFERENTIAL/PLATELET  HCG, QUANTITATIVE, PREGNANCY  URINALYSIS, ROUTINE W REFLEX MICROSCOPIC    EKG None  Radiology CT ABDOMEN PELVIS W CONTRAST Result Date: 03/10/2023 CLINICAL DATA:  Postop recent gallbladder removal. Right upper quadrant tenderness. EXAM: CT ABDOMEN AND PELVIS WITH CONTRAST TECHNIQUE: Multidetector CT imaging of the abdomen and pelvis was performed using the standard protocol following bolus administration of intravenous contrast. RADIATION DOSE REDUCTION: This exam was performed according to the departmental dose-optimization program which includes automated exposure control, adjustment of the mA and/or kV according to patient size and/or use of iterative reconstruction technique. CONTRAST:  75 cc Omnipaque 350 IV contrast COMPARISON:   Preoperative ultrasound 12/28/2022. HIDA scan 01/05/2023 FINDINGS: Lower chest: There is some linear opacity lung bases likely scar or atelectasis. No pleural effusion. Hepatobiliary: Patent portal vein. Surgical clips with of the gallbladder as per history. Minimal ectasia of the intrahepatic biliary tree. There is some stranding and trace fluid in the gallbladder fossa. Pancreas: Unremarkable. No pancreatic ductal dilatation or surrounding inflammatory changes. Spleen: Normal in size without focal abnormality. Adrenals/Urinary Tract: Adrenal glands are unremarkable. Kidneys are normal, without renal calculi, focal lesion, or hydronephrosis. Bladder is unremarkable. Stomach/Bowel: Oral contrast was administered. Normal caliber stomach and small bowel. Large bowel has a normal course and caliber with moderate stool. Redundant course to the transverse colon. Cecum is in the right mid abdomen. Appendix is seen superior to this on series 6, image 73. Appendix is normal caliber. Vascular/Lymphatic: No significant vascular findings are present. No enlarged abdominal or pelvic lymph nodes. Reproductive: Retroverted uterus. No separate adnexal mass. There is a small right-sided ovarian cyst identified measuring 18 mm. No specific imaging follow-up. Other: No free intra-abdominal air. Once again stranding along the right mid abdomen as well as along the anterior abdominal wall and at the umbilicus consistent with the patient's recent presumed laparoscopic cholecystectomy Musculoskeletal: Partial sacralization right side L5. Mild degenerative changes of the spine and pelvis. IMPRESSION: Acute surgical changes cholecystectomy. There is some fluid and stranding in the gallbladder fossa with clips. Minimal ectasia of the intrahepatic biliary tree. No rim enhancing fluid collections otherwise.  No free intra-air. Moderate colonic stool with normal appendix. Electronically Signed   By: Karen Kays M.D.   On: 03/10/2023 14:44     Procedures Procedures    Medications Ordered in ED Medications  ketorolac (TORADOL) 30 MG/ML injection 30 mg (30 mg Intravenous Patient Refused/Not Given 03/10/23 1526)  dextrose 5 % in lactated ringers infusion ( Intravenous Patient Refused/Not Given 03/10/23 1526)  ondansetron (ZOFRAN) injection 4 mg (4 mg Intravenous Patient Refused/Not Given 03/10/23 1526)  traMADol (ULTRAM) tablet 50 mg (50 mg Oral Given 03/10/23 0731)  iohexol (OMNIPAQUE) 9 MG/ML oral solution 500 mL (500 mLs Oral Contrast Given 03/10/23 1200)  methocarbamol (ROBAXIN) tablet 750 mg (500 mg Oral Given 03/10/23 1520)  traMADol (ULTRAM) tablet 100 mg (50 mg Oral Given 03/10/23 1521)  iohexol (OMNIPAQUE) 350 MG/ML injection 75 mL (75 mLs Intravenous Contrast Given 03/10/23 1540)    ED Course/ Medical Decision Making/ A&P                                 Medical Decision Making Amount and/or Complexity of Data Reviewed Labs: ordered.     Differential diagnosis includes but is not limited to postop pain, postop infection,  peptic ulcer, gastritis, gastroenteritis, appendicitis, IBS, IBD, DKA, nephrolithiasis, UTI, pyelonephritis, pancreatitis, diverticulitis,  mesenteric ischemia, abdominal aortic aneurysm, small bowel obstruction, volvulus, testicular torsion in males, ovarian torsion and pregnancy related concerns in females of childbearing age    ED Course:  Patient well-appearing, stable vital signs.  Clean, dry, intact surgical incisions noted on the abdomen.  Patient mildly tender over the sites, but no rebound or guarding.  CT abdomen pelvis shows expected postsurgical changes from her cholecystectomy and moderate stool burden, but no other acute abnormalities.  I Ordered, and personally interpreted labs.  The pertinent results include:   CBC without leukocytosis, completely within normal limits CMP with slight elevation in bilirubin at 1.4, no other elevation LFTs, or creatinine.  Electrolytes within normal  limits Lipase within normal limits Pregnancy negative Suspect patient's abdominal pain is due to postsurgical pain.  May also have some constipation contributing to her pain.  She was given tramadol for pain initially upon arrival which she states helped.  Dr. Dwain Sarna who performed her surgery also came to evaluate patient and reviewed the labs and imaging, he also feels patient's pain is related to postop pain.  He added on another dose of tramadol and Robaxin to be given to the patient here.  He also ordered LR fluids, toradol, and zofran, but patient declined these medications.  Patient request to go home after receiving her tramadol because she states she feels better.  This seems appropriate given pain is better controlled, no other acute findings to suggest she may need hospitalization or further workup.  Patient stable and appropriate for discharge home this time  Impression: Postoperative pain from cholecystectomy  Disposition:  The patient was discharged home with instructions to take Tylenol, Robaxin, Zofran, and tramadol as needed for pain and nausea control.  Follow-up with Dr. Dwain Sarna at her appointment on 03/26/2023. Return precautions given.  Imaging Studies ordered: I ordered imaging studies including CT abdomen pelvis I independently visualized the imaging with scope of interpretation limited to determining acute life threatening conditions related to emergency care. Imaging showed cholecystectomy surgical changes, moderate stool burden I agree with the radiologist interpretation  Consultations Obtained: I requested consultation with the general surgeon Dr. Dwain Sarna,  and discussed lab and imaging findings as well as pertinent plan - they feel her pain is likely due to poorly controlled postsurgical pain.  He prescribed patient tramadol, Robaxin, and Zofran to use at home.  He feels patient can be discharged home if pain is better controlled with these medications  here.  External records from outside source obtained and reviewed including op note from 03/04/2023 with Dr. Dwain Sarna for cholecystectomy and primary umbilical hernia repair              Final Clinical Impression(s) / ED Diagnoses Final diagnoses:  Post-operative pain    Rx / DC Orders ED Discharge Orders          Ordered    ondansetron (ZOFRAN-ODT) 4 MG disintegrating tablet  Every 8 hours PRN        03/10/23 1515    methocarbamol (ROBAXIN) 750 MG tablet  Every 8 hours PRN        03/10/23 1515    traMADol (ULTRAM) 50 MG tablet  Every 6 hours PRN        03/10/23 1515              Arabella Merles, PA-C 03/10/23 1605    Alvira Monday, MD 03/15/23 1121

## 2023-03-10 NOTE — ED Triage Notes (Signed)
Patient had gallbladder removed last Thursday  and was doing well until last night started having right flank pain and right lower quad pain and difficulty sitting up all the way.  LBM yesterday which was normal and no urinary symptoms.

## 2023-03-10 NOTE — ED Notes (Signed)
Unable to urinate at this time.  Cup provided

## 2023-03-10 NOTE — Discharge Instructions (Addendum)
Your lab work is reassuring today.  Your kidney and pancreas labs are normal.  Your blood counts are normal and show no signs of infection.  You have a slight elevation in your bilirubin which is expected after your gallbladder removal.  The CT of your abdomen shows a moderate amount of stool in your colon, which could be contributing to some pain.  You may use 1 scoop of MiraLAX daily to help you maintain normal bowel movements.  The tramadol you are taking can contribute to constipation, so you may need to continue the MiraLAX while you are taking this medication.  Otherwise, the CT showed normal postsurgical changes, but no concerning findings.  The CT read is as below:  "IMPRESSION:  Acute surgical changes cholecystectomy. There is some fluid and  stranding in the gallbladder fossa with clips. Minimal ectasia of  the intrahepatic biliary tree.    No rim enhancing fluid collections otherwise.  No free intra-air.    Moderate colonic stool with normal appendix."   You may take up to 1000mg  of tylenol every 6 hours as needed for pain. Do not take more then 4g per day.    Dr. Dwain Sarna has prescribed a muscle relaxer called methocarbamol (Robaxin), a nausea medication called ondansetron (Zofran), and a pain medication called tramadol (Ultram).  Please take these as prescribed.  Please keep in mind you may not drive or drink while taking the tramadol.  You were given a dose here today, please have someone else drive you home.  Please attend your follow-up appointment with Dr. Dwain Sarna on 03/26/2023.  Please contact him sooner if you have any other concerns arise.  Return to the ER if you develop fevers, nausea or vomiting, uncontrolled pain, any other new or concerning symptoms.

## 2023-03-17 ENCOUNTER — Encounter: Payer: Self-pay | Admitting: Obstetrics and Gynecology

## 2023-09-01 ENCOUNTER — Telehealth: Payer: Self-pay | Admitting: Internal Medicine

## 2023-09-01 NOTE — Telephone Encounter (Signed)
 Good morning Dr. Albertus  The following patient called in to schedule a colonoscopy today. She stated that she is a family friend and that it was discussed you would be having another provider perform the procedure. She would like to know if that still stands and who would you recommend to do it. Please advise. Thank you.

## 2023-09-02 NOTE — Telephone Encounter (Signed)
 Yes, patient known to me socially.  She prefers to remain by patient for nonprocedural gastroenterology care but to have her procedures with a female provider. I had previously discussed this with Dr. Nandigam Okay for screening colonoscopy in the [facility] via previsit with Dr. Nandigam

## 2023-09-21 NOTE — Telephone Encounter (Signed)
 Left vm for patient to schedule colonoscopy

## 2023-10-28 ENCOUNTER — Encounter: Payer: Self-pay | Admitting: Gastroenterology

## 2023-11-19 ENCOUNTER — Encounter

## 2023-11-26 ENCOUNTER — Ambulatory Visit: Admitting: *Deleted

## 2023-11-26 ENCOUNTER — Encounter: Payer: Self-pay | Admitting: Gastroenterology

## 2023-11-26 VITALS — Ht 64.0 in | Wt 130.0 lb

## 2023-11-26 DIAGNOSIS — Z1211 Encounter for screening for malignant neoplasm of colon: Secondary | ICD-10-CM

## 2023-11-26 MED ORDER — NA SULFATE-K SULFATE-MG SULF 17.5-3.13-1.6 GM/177ML PO SOLN: 1.0000 | Freq: Once | ORAL | 0 refills | Status: AC

## 2023-11-26 NOTE — Progress Notes (Signed)
 Pt's name and DOB verified at the beginning of the pre-visit with 2 identifiers  Pt denies any difficulty with ambulating,sitting, laying down or rolling side to side  Pt has no issues moving head neck or swallowing  No egg or soy allergy known to patient   No issues known to pt with past sedation  No FH of Malignant Hyperthermia  Pt is not on home 02   Pt is not on blood thinners   Pt denies issues with constipation   Pt is not on dialysis  Pt denise any abnormal heart rhythms   Pt denies any upcoming cardiac testing  Patient's chart reviewed by Norleen Schillings CNRA prior to pre-visit and patient appropriate for the [facility].  Pre-visit completed and red dot placed by patient's name on their procedure day (on provider's schedule).    Visit by phone  Pt states weight is 130 lb    Pt given  both [facility] main # and MD on call # prior to instructions.  Informed pt to come in at the time discussed and is shown on PV instructions.  Pt instructed to use [employer] or GoodRx for a price reduction on prep  Instructed pt where to find PV instructions in My Ch. Copy of instructions  to be sent in mail and address read back to pt to verify correct on envelope. Instructed pt on all aspects of written instructions including med holds clothing to wear and foods to eat and not eat as well as after procedure legal restrictions and to call MD on call if needed.. Pt states understanding. Instructed pt to review instructions again prior to procedure and call main # given if has any questions or any issues. Pt states they will.

## 2023-12-03 ENCOUNTER — Encounter: Admitting: Internal Medicine

## 2023-12-10 ENCOUNTER — Encounter: Admitting: Gastroenterology

## 2024-01-21 ENCOUNTER — Ambulatory Visit: Admitting: Gastroenterology

## 2024-01-21 ENCOUNTER — Encounter: Payer: Self-pay | Admitting: Gastroenterology

## 2024-01-21 VITALS — BP 121/64 | HR 86 | Temp 98.4°F | Resp 22 | Ht 64.0 in | Wt 130.0 lb

## 2024-01-21 DIAGNOSIS — Z1211 Encounter for screening for malignant neoplasm of colon: Secondary | ICD-10-CM

## 2024-01-21 DIAGNOSIS — K648 Other hemorrhoids: Secondary | ICD-10-CM

## 2024-01-21 MED ORDER — SODIUM CHLORIDE 0.9 % IV SOLN: 500.0000 mL | Freq: Once | INTRAVENOUS | Status: DC

## 2024-01-21 NOTE — Progress Notes (Signed)
 Pt's states no medical or surgical changes since previsit or office visit.

## 2024-01-21 NOTE — Patient Instructions (Signed)
 Discharge instructions given. Handout on Hemorrhoids. Resume previous medications. YOU HAD AN ENDOSCOPIC PROCEDURE TODAY AT THE [facility]:   Refer to the procedure report that was given to you for any specific questions about what was found during the examination.  If the procedure report does not answer your questions, please call your gastroenterologist to clarify.  If you requested that your care partner not be given the details of your procedure findings, then the procedure report has been included in a sealed envelope for you to review at your convenience later.  YOU SHOULD EXPECT: Some feelings of bloating in the abdomen. Passage of more gas than usual.  Walking can help get rid of the air that was put into your GI tract during the procedure and reduce the bloating. If you had a lower endoscopy (such as a colonoscopy or flexible sigmoidoscopy) you may notice spotting of blood in your stool or on the toilet paper. If you underwent a bowel prep for your procedure, you may not have a normal bowel movement for a few days.  Please Note:  You might notice some irritation and congestion in your nose or some drainage.  This is from the oxygen used during your procedure.  There is no need for concern and it should clear up in a day or so.  SYMPTOMS TO REPORT IMMEDIATELY:  Following lower endoscopy (colonoscopy or flexible sigmoidoscopy):  Excessive amounts of blood in the stool  Significant tenderness or worsening of abdominal pains  Swelling of the abdomen that is new, acute  Fever of 100F or higher    For urgent or emergent issues, a gastroenterologist can be reached at any hour by calling [phone]. Do not use MyChart messaging for urgent concerns.    DIET:  We do recommend a small meal at first, but then you may proceed to your regular diet.  Drink plenty of fluids but you should avoid alcoholic beverages for 24 hours.  ACTIVITY:  You should plan to take it easy for the  rest of today and you should NOT DRIVE or use heavy machinery until tomorrow (because of the sedation medicines used during the test).    FOLLOW UP: Our staff will call the number listed on your records the next business day following your procedure.  We will call around 7:15- 8:00 am to check on you and address any questions or concerns that you may have regarding the information given to you following your procedure. If we do not reach you, we will leave a message.     If any biopsies were taken you will be contacted by phone or by letter within the next 1-3 weeks.  Please call us at [phone] if you have not heard about the biopsies in 3 weeks.    SIGNATURES/CONFIDENTIALITY: You and/or your care partner have signed paperwork which will be entered into your electronic medical record.  These signatures attest to the fact that that the information above on your After Visit Summary has been reviewed and is understood.  Full responsibility of the confidentiality of this discharge information lies with you and/or your care-partner.

## 2024-01-21 NOTE — Op Note (Addendum)
 [facility] Patient Name: [name] Procedure Date: 01/21/2024 10:52 AM MRN: [id number] Endoscopist: Gustav ALONSO Mcgee , MD, [id number] Age: 48 Referring MD:  Date of Birth: March 05, 1975 Gender: Female Account #: [id number] Procedure:                Colonoscopy Indications:              Screening for colorectal malignant neoplasm Medicines:                Monitored Anesthesia Care Procedure:                Pre-Anesthesia Assessment:                           - Prior to the procedure, a History and Physical                            was performed, and patient medications and                            allergies were reviewed. The patient's tolerance of                            previous anesthesia was also reviewed. The risks                            and benefits of the procedure and the sedation                            options and risks were discussed with the patient.                            All questions were answered, and informed consent                            was obtained. Prior Anticoagulants: The patient has                            taken no anticoagulant or antiplatelet agents. ASA                            Grade Assessment: I - A normal, healthy patient.                            After reviewing the risks and benefits, the patient                            was deemed in satisfactory condition to undergo the                            procedure.                           After obtaining informed consent, the colonoscope  was passed under direct vision. Throughout the                            procedure, the patient's blood pressure, pulse, and                            oxygen saturations were monitored continuously. The                            Olympus Scope SN: [id number] was introduced through                            the anus and advanced to the the terminal ileum,                            with identification  of the appendiceal orifice and                            IC valve. The colonoscopy was somewhat difficult                            due to the patient's cardiovascular instability                            (vasovagal reaction). Successful completion of the                            procedure was aided by changing the patient to a                            supine position and Robinul . The patient tolerated                            the procedure well. The quality of the bowel                            preparation was excellent. The ileocecal valve,                            appendiceal orifice, and rectum were photographed. Scope In: 11:09:33 AM Scope Out: 11:32:24 AM Scope Withdrawal Time: 0 hours 10 minutes 40 seconds  Total Procedure Duration: 0 hours 22 minutes 51 seconds  Findings:                 The perianal and digital rectal examinations were                            normal.                           The entire examined colon appeared normal on direct                            and retroflexion views.  The terminal ileum appeared normal.                           Non-bleeding internal hemorrhoids were found during                            retroflexion. The hemorrhoids were small. Complications:            No immediate complications. Estimated Blood Loss:     Estimated blood loss: none. Impression:               - The entire examined colon is normal on direct and                            retroflexion views.                           - The examined portion of the ileum was normal.                           - Non-bleeding internal hemorrhoids.                           - No specimens collected. Recommendation:           - Resume previous diet.                           - Continue present medications.                           - Repeat colonoscopy in 10 years for surveillance. Sam Wunschel V. Calem Cocozza, MD 01/21/2024 11:38:10 AM This report has  been signed electronically.

## 2024-01-21 NOTE — Progress Notes (Unsigned)
 A/o x 3, VSS, good SR's, pleased with anesthesia, report to RN

## 2024-01-21 NOTE — Progress Notes (Unsigned)
 [facility] History and Physical   Primary Care Physician:  Sophronia Ozell BROCKS, MD   Reason for Procedure:  Colorectal cancer screening  Plan:    Screening colonoscopy with possible interventions as needed     HPI: [name] is a very pleasant 48 y.o. female here for screening colonoscopy. Denies any nausea, vomiting, abdominal pain, melena or bright red blood per rectum  The risks and benefits as well as alternatives of endoscopic procedure(s) have been discussed and reviewed.  The patient was provided an opportunity to ask questions and all were answered. The patient agreed with the plan and demonstrated an understanding of the instructions.   Past Medical History:  Diagnosis Date   Ectopic pregnancy    Low vitamin D level     Past Surgical History:  Procedure Laterality Date   CHOLECYSTECTOMY N/A 03/04/2023   Procedure: LAPAROSCOPIC CHOLECYSTECTOMY WITH ICG DYE,;  Surgeon: Ebbie Cough, MD;  Location: [facility] OR;  Service: General;  Laterality: N/A;  GEN/TAP BLOCK   DILATION AND CURETTAGE OF UTERUS     SALPINGECTOMY     Due to ectopic pregnancy   UMBILICAL HERNIA REPAIR N/A 03/04/2023   Procedure: HERNIA REPAIR UMBILICAL ADULT;  Surgeon: Ebbie Cough, MD;  Location: [facility] OR;  Service: General;  Laterality: N/A;    Prior to Admission medications  Medication Sig Start Date End Date Taking? Authorizing Provider  LO LOESTRIN FE 1 MG-10 MCG / 10 MCG tablet Take 1 tablet by mouth daily. 01/09/24  Yes [provider]  Multiple Vitamin (MULTIVITAMIN ADULT PO) daily at 12 noon.   Yes [provider]  ibuprofen (ADVIL) 200 MG tablet Take 600 tablets by mouth every 6 (six) hours as needed for fever, headache, mild pain (pain score 1-3) or moderate pain (pain score 4-6).    [provider]  medroxyPROGESTERone (PROVERA) 10 MG tablet Take 10 mg by mouth daily. 11/19/23   [provider]    Current Outpatient Medications   Medication Sig Dispense Refill   LO LOESTRIN FE 1 MG-10 MCG / 10 MCG tablet Take 1 tablet by mouth daily.     Multiple Vitamin (MULTIVITAMIN ADULT PO) daily at 12 noon.     ibuprofen (ADVIL) 200 MG tablet Take 600 tablets by mouth every 6 (six) hours as needed for fever, headache, mild pain (pain score 1-3) or moderate pain (pain score 4-6).     medroxyPROGESTERone (PROVERA) 10 MG tablet Take 10 mg by mouth daily.     Current Facility-Administered Medications  Medication Dose Route Frequency Provider Last Rate Last Admin   0.9 %  sodium chloride  infusion  500 mL Intravenous Once Dorrine Montone V, MD        Allergies as of 01/21/2024 - Review Complete 01/21/2024  Allergen Reaction Noted   Codeine Nausea And Vomiting 11/23/2016    Family History  Problem Relation Age of Onset   Colon cancer Neg Hx    Colon polyps Neg Hx    Esophageal cancer Neg Hx    Rectal cancer Neg Hx    Stomach cancer Neg Hx     Social History   Socioeconomic History   Marital status: Married    Spouse name: Not on file   Number of children: Not on file   Years of education: Not on file   Highest education level: Not on file  Occupational History   Not on file  Tobacco Use   Smoking status: Never   Smokeless tobacco: Never  Vaping  Use   Vaping status: Never Used  Substance and Sexual Activity   Alcohol use: Yes    Comment: 2-3 times a week   Drug use: No   Sexual activity: Yes    Birth control/protection: Pill  Other Topics Concern   Not on file  Social History Narrative   Not on file   Social Drivers of Health   Tobacco Use: Low Risk (01/21/2024)   Patient History    Smoking Tobacco Use: Never    Smokeless Tobacco Use: Never    Passive Exposure: Not on file  Financial Resource Strain: Low Risk (03/17/2023)   Received from [facility]   Overall Financial Resource Strain (CARDIA)    Difficulty of Paying Living Expenses: Not hard at all  Food Insecurity: No Food Insecurity (03/17/2023)    Received from [facility]   Epic    Within the past 12 months, you worried that your food would run out before you got the money to buy more.: Never true    Within the past 12 months, the food you bought just didn't last and you didn't have money to get more.: Never true  Transportation Needs: No Transportation Needs (03/17/2023)   Received from [facility] - Transportation    Lack of Transportation (Medical): No    Lack of Transportation (Non-Medical): No  Physical Activity: Sufficiently Active (03/17/2023)   Received from [facility]   Exercise Vital Sign    On average, how many days per week do you engage in moderate to strenuous exercise (like a brisk walk)?: 5 days    On average, how many minutes do you engage in exercise at this level?: 60 min  Stress: No Stress Concern Present (03/17/2023)   Received from [facility] - Occupational Stress Questionnaire    Feeling of Stress : Not at all  Social Connections: Socially Integrated (03/17/2023)   Received from [facility]   Social Network    How would you rate your social network (family, work, friends)?: Good participation with social networks  Intimate Partner Violence: Not At Risk (09/21/2022)   Received from [facility]   HITS    Over the last 12 months how often did your partner physically hurt you?: Never    Over the last 12 months how often did your partner insult you or talk down to you?: Never    Over the last 12 months how often did your partner threaten you with physical harm?: Never    Over the last 12 months how often did your partner scream or curse at you?: Never  Depression (PHQ2-9): Not on file  Alcohol Screen: Not on file  Housing: Unknown (03/26/2023)   Received from [facility]   Epic    At any time in the past 12 months, were you homeless or living in a shelter (including now)?: No    Unable to Pay for Housing in the Last Year: Not on  file    Number of Times Moved in the Last Year: Not on file  Utilities: Not At Risk (03/17/2023)   Received from [facility] Utilities    Threatened with loss of utilities: No  Health Literacy: Not on file    Review of Systems:  All other review of systems negative except as mentioned in the HPI.  Physical Exam: Vital signs in last 24 hours: BP 125/67   Pulse 77   Temp 98.4  F (36.9 C) (Temporal)   Ht 5' 4 (1.626 m)   Wt 130 lb (59 kg)   LMP 12/17/2021   SpO2 100%   BMI 22.31 kg/m  General:   Alert, NAD Lungs:  Clear .   Heart:  Regular rate and rhythm Abdomen:  Soft, nontender and nondistended. Neuro/Psych:  Alert and cooperative. Normal mood and affect. A and O x 3  Reviewed labs, radiology imaging, old records and pertinent past GI work up  Patient is appropriate for planned procedure(s) and anesthesia in an ambulatory setting   K. Veena Jaszmine Navejas , MD [phone]

## 2024-01-24 ENCOUNTER — Telehealth: Payer: Self-pay

## 2024-01-24 NOTE — Telephone Encounter (Signed)
°  Follow up Call-     01/21/2024   10:25 AM  Call back number  Post procedure Call Back phone  # [phone]  Permission to leave phone message Yes     Patient questions:  Do you have a fever, pain , or abdominal swelling? No. Pain Score  0 *  Have you tolerated food without any problems? Yes.    Have you been able to return to your normal activities? Yes.    Do you have any questions about your discharge instructions: Diet   No. Medications  No. Follow up visit  No.  Do you have questions or concerns about your Care? No.  Actions: * If pain score is 4 or above: No action needed, pain <4.

## 2024-03-17 ENCOUNTER — Encounter: Payer: Self-pay | Admitting: Radiology

## 2024-03-17 ENCOUNTER — Ambulatory Visit: Payer: Self-pay | Admitting: Radiology

## 2024-03-17 VITALS — BP 110/74 | Ht 64.25 in | Wt 139.0 lb

## 2024-03-17 DIAGNOSIS — N951 Menopausal and female climacteric states: Secondary | ICD-10-CM

## 2024-03-17 MED ORDER — ESTRADIOL 0.025 MG/24HR TD PTTW: 1.0000 | MEDICATED_PATCH | TRANSDERMAL | 0 refills | Status: AC

## 2024-03-17 MED ORDER — PROGESTERONE MICRONIZED 100 MG PO CAPS: 100.0000 mg | ORAL_CAPSULE | Freq: Every evening | ORAL | 0 refills | Status: AC

## 2024-03-17 NOTE — Progress Notes (Signed)
" ° ° °  [name] [birth year] [id number]   History:  49 y.o. [id number] presents to discuss HRT. Transfer from [facility]. C/o trouble sleeping, weight gain, skin changes, fatigue. Hot flashes and night sweats. Tried LoLoestrin which did not help and caused more bloat and weight gain. D&C 12/24 for polyps. FSH has varied from 70 in 2024 to perimenopausal range 6 months later. Interested in HRT for the protective benefits.  Gynecologic History Patient's last menstrual period was 12/17/2021.   Sexually active: yes Last Pap: 2025. Results were: normal per pt at [facility] Last mammogram: 2025. Results were: normal  Obstetric History OB History  Gravida Para Term Preterm AB Living  5 2 2  3 2   SAB IAB Ectopic Multiple Live Births  2  1  2     # Outcome Date GA Lbr Len/2nd Weight Sex Type Anes PTL Lv  5 SAB           4 SAB           3 Ectopic           2 Term           1 Term               The following portions of the patient's history were reviewed and updated as appropriate: allergies, current medications, past family history, past medical history, past social history, past surgical history, and problem list.  Review of Systems  All other systems reviewed and are negative.   Past medical history, past surgical history, family history and social history were all reviewed and documented in the EPIC chart.  Exam:  Vitals:   03/17/24 1407  BP: 110/74  Weight: 139 lb (63 kg)  Height: 5' 4.25 (1.632 m)   Body mass index is 23.67 kg/m.  Physical Exam Constitutional:      Appearance: Normal appearance.  Pulmonary:     Effort: Pulmonary effort is normal.  Neurological:     Mental Status: She is alert.  Psychiatric:        Mood and Affect: Mood normal.        Thought Content: Thought content normal.        Judgment: Judgment normal.      [name], CMA present for exam  Assessment/Plan:   1. Perimenopausal (Primary) - estradiol  (DOTTI ) 0.025 MG/24HR; Place 1 patch onto the skin 2  (two) times a week.  Dispense: 24 patch; Refill: 0 - progesterone  (PROMETRIUM ) 100 MG capsule; Take 1 capsule (100 mg total) by mouth at bedtime.  Dispense: 90 capsule; Refill: 0     Risks and benefits reviewed. Discussed transdermal safety vs oral estradiol  risks. HRT is highly effective for vasomotor symptoms (hot flashes, night sweats) and vaginal discomfort. Prevents bone loss (osteoporosis) and reduces fracture risk by up to 50%. Stabilizes hormone levels to reduce insomnia, anxiety, and depression. Potential reduced risk of heart disease and stroke in women under 60 when started early. May improve skin elasticity, muscle strength, and reduce the risk of colorectal cancer.   Return in about 11 weeks (around 06/02/2024) for Med Follow-up.  [name] B WHNP-BC 2:47 PM 03/17/2024        "

## 2024-06-16 ENCOUNTER — Ambulatory Visit: Admitting: Radiology
